# Patient Record
Sex: Male | Born: 1970
Health system: Southern US, Community
[De-identification: ages and names within clinical notes are randomized; demographics above are authoritative.]

---

## 2019-05-24 ENCOUNTER — Inpatient Hospital Stay (HOSPITAL_COMMUNITY): Payer: Medicaid Other

## 2019-05-24 ENCOUNTER — Inpatient Hospital Stay (HOSPITAL_COMMUNITY)
Admission: AD | Admit: 2019-05-24 | Discharge: 2019-06-01 | DRG: 870 | Disposition: A | Discharge status: Home Health | Payer: Medicaid Other | Source: Other Acute Inpatient Hospital | Attending: Internal Medicine | Admitting: Internal Medicine

## 2019-05-24 DIAGNOSIS — R319 Hematuria, unspecified: Secondary | ICD-10-CM | POA: Diagnosis present

## 2019-05-24 DIAGNOSIS — J95851 Ventilator associated pneumonia: Secondary | ICD-10-CM | POA: Diagnosis not present

## 2019-05-24 DIAGNOSIS — F1290 Cannabis use, unspecified, uncomplicated: Secondary | ICD-10-CM | POA: Diagnosis present

## 2019-05-24 DIAGNOSIS — S31000D Unspecified open wound of lower back and pelvis without penetration into retroperitoneum, subsequent encounter: Secondary | ICD-10-CM

## 2019-05-24 DIAGNOSIS — L89154 Pressure ulcer of sacral region, stage 4: Secondary | ICD-10-CM | POA: Diagnosis not present

## 2019-05-24 DIAGNOSIS — Z95828 Presence of other vascular implants and grafts: Secondary | ICD-10-CM | POA: Diagnosis not present

## 2019-05-24 DIAGNOSIS — S31000A Unspecified open wound of lower back and pelvis without penetration into retroperitoneum, initial encounter: Secondary | ICD-10-CM | POA: Diagnosis present

## 2019-05-24 DIAGNOSIS — Z1624 Resistance to multiple antibiotics: Secondary | ICD-10-CM | POA: Diagnosis present

## 2019-05-24 DIAGNOSIS — A4152 Sepsis due to Pseudomonas: Secondary | ICD-10-CM | POA: Diagnosis present

## 2019-05-24 DIAGNOSIS — G8250 Quadriplegia, unspecified: Secondary | ICD-10-CM | POA: Diagnosis not present

## 2019-05-24 DIAGNOSIS — J189 Pneumonia, unspecified organism: Secondary | ICD-10-CM | POA: Diagnosis not present

## 2019-05-24 DIAGNOSIS — Z791 Long term (current) use of non-steroidal anti-inflammatories (NSAID): Secondary | ICD-10-CM

## 2019-05-24 DIAGNOSIS — F1111 Opioid abuse, in remission: Secondary | ICD-10-CM | POA: Diagnosis present

## 2019-05-24 DIAGNOSIS — B1920 Unspecified viral hepatitis C without hepatic coma: Secondary | ICD-10-CM | POA: Diagnosis present

## 2019-05-24 DIAGNOSIS — D638 Anemia in other chronic diseases classified elsewhere: Secondary | ICD-10-CM | POA: Diagnosis present

## 2019-05-24 DIAGNOSIS — F419 Anxiety disorder, unspecified: Secondary | ICD-10-CM | POA: Diagnosis present

## 2019-05-24 DIAGNOSIS — Z931 Gastrostomy status: Secondary | ICD-10-CM

## 2019-05-24 DIAGNOSIS — F17210 Nicotine dependence, cigarettes, uncomplicated: Secondary | ICD-10-CM | POA: Diagnosis present

## 2019-05-24 DIAGNOSIS — Z20828 Contact with and (suspected) exposure to other viral communicable diseases: Secondary | ICD-10-CM | POA: Diagnosis present

## 2019-05-24 DIAGNOSIS — G40909 Epilepsy, unspecified, not intractable, without status epilepticus: Secondary | ICD-10-CM | POA: Diagnosis present

## 2019-05-24 DIAGNOSIS — J9690 Respiratory failure, unspecified, unspecified whether with hypoxia or hypercapnia: Secondary | ICD-10-CM

## 2019-05-24 DIAGNOSIS — Y95 Nosocomial condition: Secondary | ICD-10-CM | POA: Diagnosis present

## 2019-05-24 DIAGNOSIS — Z9359 Other cystostomy status: Secondary | ICD-10-CM

## 2019-05-24 DIAGNOSIS — Z936 Other artificial openings of urinary tract status: Secondary | ICD-10-CM

## 2019-05-24 DIAGNOSIS — Z825 Family history of asthma and other chronic lower respiratory diseases: Secondary | ICD-10-CM

## 2019-05-24 DIAGNOSIS — F1011 Alcohol abuse, in remission: Secondary | ICD-10-CM | POA: Diagnosis present

## 2019-05-24 DIAGNOSIS — Z452 Encounter for adjustment and management of vascular access device: Secondary | ICD-10-CM | POA: Diagnosis not present

## 2019-05-24 DIAGNOSIS — L8990 Pressure ulcer of unspecified site, unspecified stage: Secondary | ICD-10-CM | POA: Diagnosis present

## 2019-05-24 DIAGNOSIS — F1020 Alcohol dependence, uncomplicated: Secondary | ICD-10-CM | POA: Diagnosis not present

## 2019-05-24 DIAGNOSIS — J9621 Acute and chronic respiratory failure with hypoxia: Secondary | ICD-10-CM | POA: Diagnosis present

## 2019-05-24 DIAGNOSIS — I9589 Other hypotension: Secondary | ICD-10-CM | POA: Diagnosis not present

## 2019-05-24 DIAGNOSIS — Z933 Colostomy status: Secondary | ICD-10-CM | POA: Diagnosis not present

## 2019-05-24 DIAGNOSIS — Z6820 Body mass index (BMI) 20.0-20.9, adult: Secondary | ICD-10-CM

## 2019-05-24 DIAGNOSIS — R509 Fever, unspecified: Secondary | ICD-10-CM | POA: Diagnosis not present

## 2019-05-24 DIAGNOSIS — Z930 Tracheostomy status: Secondary | ICD-10-CM

## 2019-05-24 DIAGNOSIS — Z8619 Personal history of other infectious and parasitic diseases: Secondary | ICD-10-CM

## 2019-05-24 DIAGNOSIS — E440 Moderate protein-calorie malnutrition: Secondary | ICD-10-CM | POA: Diagnosis present

## 2019-05-24 DIAGNOSIS — G4700 Insomnia, unspecified: Secondary | ICD-10-CM | POA: Diagnosis present

## 2019-05-24 DIAGNOSIS — R6521 Severe sepsis with septic shock: Secondary | ICD-10-CM | POA: Diagnosis present

## 2019-05-24 DIAGNOSIS — Z792 Long term (current) use of antibiotics: Secondary | ICD-10-CM

## 2019-05-24 DIAGNOSIS — G4733 Obstructive sleep apnea (adult) (pediatric): Secondary | ICD-10-CM | POA: Diagnosis present

## 2019-05-24 DIAGNOSIS — Z880 Allergy status to penicillin: Secondary | ICD-10-CM

## 2019-05-24 DIAGNOSIS — Z79899 Other long term (current) drug therapy: Secondary | ICD-10-CM

## 2019-05-24 DIAGNOSIS — A419 Sepsis, unspecified organism: Secondary | ICD-10-CM | POA: Diagnosis present

## 2019-05-24 DIAGNOSIS — B182 Chronic viral hepatitis C: Secondary | ICD-10-CM | POA: Diagnosis present

## 2019-05-24 DIAGNOSIS — K7460 Unspecified cirrhosis of liver: Secondary | ICD-10-CM | POA: Diagnosis not present

## 2019-05-24 DIAGNOSIS — G894 Chronic pain syndrome: Secondary | ICD-10-CM | POA: Diagnosis present

## 2019-05-24 DIAGNOSIS — Z8739 Personal history of other diseases of the musculoskeletal system and connective tissue: Secondary | ICD-10-CM | POA: Diagnosis not present

## 2019-05-24 DIAGNOSIS — Z9911 Dependence on respirator [ventilator] status: Secondary | ICD-10-CM | POA: Diagnosis not present

## 2019-05-24 DIAGNOSIS — R000 Tachycardia, unspecified: Secondary | ICD-10-CM | POA: Diagnosis not present

## 2019-05-24 DIAGNOSIS — B965 Pseudomonas (aeruginosa) (mallei) (pseudomallei) as the cause of diseases classified elsewhere: Secondary | ICD-10-CM | POA: Diagnosis not present

## 2019-05-24 DIAGNOSIS — I10 Essential (primary) hypertension: Secondary | ICD-10-CM | POA: Diagnosis present

## 2019-05-24 DIAGNOSIS — Y848 Other medical procedures as the cause of abnormal reaction of the patient, or of later complication, without mention of misadventure at the time of the procedure: Secondary | ICD-10-CM | POA: Diagnosis present

## 2019-05-24 DIAGNOSIS — Z7901 Long term (current) use of anticoagulants: Secondary | ICD-10-CM

## 2019-05-24 DIAGNOSIS — L89004 Pressure ulcer of unspecified elbow, stage 4: Secondary | ICD-10-CM | POA: Diagnosis not present

## 2019-05-24 LAB — CREATININE, SERUM
Creatinine, Ser: 0.42 mg/dL — ABNORMAL LOW (ref 0.61–1.24)
GFR calc Af Amer: >60 mL/min (ref >60)
GFR calc non Af Amer: >60 mL/min (ref >60)

## 2019-05-24 LAB — CBC
HCT: 31.8 % — ABNORMAL LOW (ref 39.0–52.0)
Hemoglobin: 9.3 g/dL — ABNORMAL LOW (ref 13.0–17.0)
MCH: 27.2 pg (ref 26.0–34.0)
MCHC: 29.2 g/dL — ABNORMAL LOW (ref 30.0–36.0)
MCV: 93 fL (ref 80.0–100.0)
Platelets: 460 10*3/uL — ABNORMAL HIGH (ref 150–400)
RBC: 3.42 MIL/uL — ABNORMAL LOW (ref 4.22–5.81)
RDW: 16.4 % — ABNORMAL HIGH (ref 11.5–15.5)
WBC: 18.2 10*3/uL — ABNORMAL HIGH (ref 4.0–10.5)
nRBC: 0 % (ref 0.0–0.2)

## 2019-05-24 MED ORDER — FENTANYL CITRATE (PF) 100 MCG/2ML IJ SOLN
50.0000 ug | INTRAMUSCULAR | Status: DC | PRN
  Administered 2019-05-24 – 2019-05-25 (×3): 50 ug via INTRAVENOUS
  Administered 2019-05-25: 75 ug via INTRAVENOUS
  Administered 2019-05-26: 01:00:00 50 ug via INTRAVENOUS
  Administered 2019-05-26 (×2): 100 ug via INTRAVENOUS
  Administered 2019-05-26: 75 ug via INTRAVENOUS
  Administered 2019-05-27: 100 ug via INTRAVENOUS
  Administered 2019-05-27 – 2019-05-29 (×4): 50 ug via INTRAVENOUS
  Administered 2019-05-29: 100 ug via INTRAVENOUS
  Administered 2019-05-30: 50 ug via INTRAVENOUS
  Administered 2019-05-31: 100 ug via INTRAVENOUS
  Administered 2019-05-31 (×2): 50 ug via INTRAVENOUS
  Filled 2019-05-24 (×19): qty 2

## 2019-05-24 MED ORDER — IPRATROPIUM-ALBUTEROL 0.5-2.5 (3) MG/3ML IN SOLN
3.0000 mL | Freq: Four times a day (QID) | RESPIRATORY_TRACT | Status: DC
  Administered 2019-05-24 – 2019-06-01 (×32): 3 mL via RESPIRATORY_TRACT
  Filled 2019-05-24 (×33): qty 3

## 2019-05-24 MED ORDER — QUETIAPINE FUMARATE 50 MG PO TABS
50.0000 mg | ORAL_TABLET | Freq: Two times a day (BID) | ORAL | Status: DC
  Administered 2019-05-24 – 2019-05-28 (×8): 50 mg
  Filled 2019-05-24 (×8): qty 1

## 2019-05-24 MED ORDER — FENTANYL CITRATE (PF) 100 MCG/2ML IJ SOLN
50.0000 ug | INTRAMUSCULAR | Status: AC | PRN
  Administered 2019-05-26 (×3): 50 ug via INTRAVENOUS
  Filled 2019-05-24 (×4): qty 2

## 2019-05-24 MED ORDER — DOCUSATE SODIUM 50 MG/5ML PO LIQD: 100.0000 mg | Freq: Two times a day (BID) | ORAL | Status: DC | PRN

## 2019-05-24 MED ORDER — PHENYLEPHRINE HCL-NACL 10-0.9 MG/250ML-% IV SOLN: 25.0000 ug/min | INTRAVENOUS | Status: DC

## 2019-05-24 MED ORDER — FOLIC ACID 5 MG/ML IJ SOLN
1.0000 mg | Freq: Every day | INTRAMUSCULAR | Status: DC
  Administered 2019-05-24 – 2019-05-27 (×4): 1 mg via INTRAVENOUS
  Filled 2019-05-24 (×5): qty 0.2

## 2019-05-24 MED ORDER — THIAMINE HCL 100 MG/ML IJ SOLN
100.0000 mg | INTRAMUSCULAR | Status: DC
  Administered 2019-05-25 – 2019-05-27 (×4): 100 mg via INTRAVENOUS
  Filled 2019-05-24 (×5): qty 2

## 2019-05-24 MED ORDER — GABAPENTIN 250 MG/5ML PO SOLN
100.0000 mg | Freq: Three times a day (TID) | ORAL | Status: DC
  Administered 2019-05-24 – 2019-05-28 (×11): 100 mg
  Filled 2019-05-24 (×13): qty 2

## 2019-05-24 MED ORDER — ACETAMINOPHEN 160 MG/5ML PO SOLN
650.0000 mg | ORAL | Status: DC | PRN
  Administered 2019-05-24 – 2019-05-28 (×6): 650 mg
  Filled 2019-05-24 (×6): qty 20.3

## 2019-05-24 MED ORDER — HEPARIN SODIUM (PORCINE) 5000 UNIT/ML IJ SOLN
5000.0000 [IU] | Freq: Three times a day (TID) | INTRAMUSCULAR | Status: DC
  Administered 2019-05-24 – 2019-06-01 (×24): 5000 [IU] via SUBCUTANEOUS
  Filled 2019-05-24 (×24): qty 1

## 2019-05-24 MED ORDER — SODIUM CHLORIDE 0.9 % IV SOLN
250.0000 mL | INTRAVENOUS | Status: DC
  Administered 2019-05-24 – 2019-05-31 (×3): 250 mL via INTRAVENOUS

## 2019-05-24 MED ORDER — SODIUM CHLORIDE 0.9 % IV SOLN: INTRAVENOUS | Status: DC | PRN

## 2019-05-24 MED ORDER — FAMOTIDINE IN NACL 20-0.9 MG/50ML-% IV SOLN
20.0000 mg | Freq: Two times a day (BID) | INTRAVENOUS | Status: DC
  Administered 2019-05-24 – 2019-05-28 (×8): 20 mg via INTRAVENOUS
  Filled 2019-05-24 (×8): qty 50
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
## 2019-05-24 NOTE — Progress Notes (Signed)
Patient arrived to unit via [facility] with artificial airway already in place and patent. Placed patient on the ventilator with current settings per [facility] at 2042. Awaiting orders at this time.

## 2019-05-24 NOTE — Progress Notes (Signed)
[facility]Link Physician-Brief Progress Note Patient Name: Cody Vaughn DOB: [birth year] MRN: [id number]   Date of Service  05/24/2019  HPI/Events of Note  48 y/o M quadriplegic presumably from osteomyelitis, vent dependent s/p trach, PEG, colostomy and cystostomy, history of heroin use, Hep C/ETOH cirrhosis.  He was brought in due to fever and SOB  eICU Interventions   Antibiotics for pneumonia on CXR and possible UTI/erythema on cystostomy site  Fluid resuscitation as per sepsis protocol, pressors as needed  Respiratory support     Intervention Category Major Interventions: Respiratory failure - evaluation and management;Sepsis - evaluation and management Evaluation Type: New Patient Evaluation  Darl Pikes 05/24/2019, 9:55 PM

## 2019-05-25 ENCOUNTER — Inpatient Hospital Stay (HOSPITAL_COMMUNITY): Payer: Medicaid Other

## 2019-05-25 ENCOUNTER — Encounter (HOSPITAL_COMMUNITY): Payer: Self-pay | Admitting: Radiology

## 2019-05-25 DIAGNOSIS — K7460 Unspecified cirrhosis of liver: Secondary | ICD-10-CM

## 2019-05-25 DIAGNOSIS — S31000A Unspecified open wound of lower back and pelvis without penetration into retroperitoneum, initial encounter: Secondary | ICD-10-CM | POA: Diagnosis present

## 2019-05-25 DIAGNOSIS — A419 Sepsis, unspecified organism: Secondary | ICD-10-CM | POA: Diagnosis present

## 2019-05-25 DIAGNOSIS — F1020 Alcohol dependence, uncomplicated: Secondary | ICD-10-CM

## 2019-05-25 DIAGNOSIS — R509 Fever, unspecified: Secondary | ICD-10-CM

## 2019-05-25 DIAGNOSIS — R6521 Severe sepsis with septic shock: Secondary | ICD-10-CM

## 2019-05-25 DIAGNOSIS — L89154 Pressure ulcer of sacral region, stage 4: Secondary | ICD-10-CM

## 2019-05-25 DIAGNOSIS — Z9359 Other cystostomy status: Secondary | ICD-10-CM

## 2019-05-25 DIAGNOSIS — I9589 Other hypotension: Secondary | ICD-10-CM

## 2019-05-25 DIAGNOSIS — B182 Chronic viral hepatitis C: Secondary | ICD-10-CM

## 2019-05-25 DIAGNOSIS — G8250 Quadriplegia, unspecified: Secondary | ICD-10-CM

## 2019-05-25 DIAGNOSIS — Z8619 Personal history of other infectious and parasitic diseases: Secondary | ICD-10-CM

## 2019-05-25 DIAGNOSIS — Z452 Encounter for adjustment and management of vascular access device: Secondary | ICD-10-CM

## 2019-05-25 DIAGNOSIS — J9621 Acute and chronic respiratory failure with hypoxia: Secondary | ICD-10-CM

## 2019-05-25 DIAGNOSIS — Z9911 Dependence on respirator [ventilator] status: Secondary | ICD-10-CM

## 2019-05-25 LAB — URINALYSIS, ROUTINE W REFLEX MICROSCOPIC
Bilirubin Urine: NEGATIVE
Glucose, UA: NEGATIVE mg/dL
Ketones, ur: NEGATIVE mg/dL
Nitrite: POSITIVE — AB
Protein, ur: 30 mg/dL — AB
Specific Gravity, Urine: 1.018 (ref 1.005–1.030)
pH: 6 (ref 5.0–8.0)

## 2019-05-25 LAB — STREP PNEUMONIAE URINARY ANTIGEN: Strep Pneumo Urinary Antigen: NEGATIVE

## 2019-05-25 LAB — RESPIRATORY PANEL BY PCR

## 2019-05-25 LAB — MAGNESIUM: Magnesium: 1.9 mg/dL (ref 1.7–2.4)

## 2019-05-25 LAB — HIV ANTIBODY (ROUTINE TESTING W REFLEX): HIV Screen 4th Generation wRfx: NONREACTIVE

## 2019-05-25 LAB — BLOOD GAS, ARTERIAL
Acid-Base Excess: 3.6 mmol/L — ABNORMAL HIGH (ref 0.0–2.0)
Bicarbonate: 28.4 mmol/L — ABNORMAL HIGH (ref 20.0–28.0)
Drawn by: 235321
FIO2: 80
MECHVT: 420 mL
O2 Saturation: 99.2 %
PEEP: 8 cmH2O
Patient temperature: 98.6
RATE: 30 resp/min
pCO2 arterial: 47.4 mmHg (ref 32.0–48.0)
pH, Arterial: 7.395 (ref 7.350–7.450)
pO2, Arterial: 170 mmHg — ABNORMAL HIGH (ref 83.0–108.0)

## 2019-05-25 LAB — CBC
HCT: 26 % — ABNORMAL LOW (ref 39.0–52.0)
Hemoglobin: 7.9 g/dL — ABNORMAL LOW (ref 13.0–17.0)
MCH: 28 pg (ref 26.0–34.0)
MCHC: 30.4 g/dL (ref 30.0–36.0)
MCV: 92.2 fL (ref 80.0–100.0)
Platelets: 464 10*3/uL — ABNORMAL HIGH (ref 150–400)
RBC: 2.82 MIL/uL — ABNORMAL LOW (ref 4.22–5.81)
RDW: 16.4 % — ABNORMAL HIGH (ref 11.5–15.5)
WBC: 24.8 10*3/uL — ABNORMAL HIGH (ref 4.0–10.5)
nRBC: 0 % (ref 0.0–0.2)

## 2019-05-25 LAB — BASIC METABOLIC PANEL
Anion gap: 8 (ref 5–15)
BUN: 12 mg/dL (ref 6–20)
CO2: 23 mmol/L (ref 22–32)
Calcium: 8.3 mg/dL — ABNORMAL LOW (ref 8.9–10.3)
Chloride: 110 mmol/L (ref 98–111)
Creatinine, Ser: <0.3 mg/dL — ABNORMAL LOW (ref 0.61–1.24)
Glucose, Bld: 126 mg/dL — ABNORMAL HIGH (ref 70–99)
Potassium: 3 mmol/L — ABNORMAL LOW (ref 3.5–5.1)
Sodium: 141 mmol/L (ref 135–145)

## 2019-05-25 LAB — GLUCOSE, CAPILLARY
Glucose-Capillary: 100 mg/dL — ABNORMAL HIGH (ref 70–99)
Glucose-Capillary: 109 mg/dL — ABNORMAL HIGH (ref 70–99)
Glucose-Capillary: 113 mg/dL — ABNORMAL HIGH (ref 70–99)
Glucose-Capillary: 113 mg/dL — ABNORMAL HIGH (ref 70–99)

## 2019-05-25 LAB — RAPID URINE DRUG SCREEN, HOSP PERFORMED
Amphetamines: NOT DETECTED
Barbiturates: NOT DETECTED
Benzodiazepines: NOT DETECTED
Cocaine: NOT DETECTED
Opiates: NOT DETECTED
Tetrahydrocannabinol: NOT DETECTED

## 2019-05-25 LAB — PHOSPHORUS: Phosphorus: 5.6 mg/dL — ABNORMAL HIGH (ref 2.5–4.6)

## 2019-05-25 LAB — ECHOCARDIOGRAM COMPLETE
Height: 69 in
Weight: 2105.83 oz

## 2019-05-25 LAB — MRSA PCR SCREENING: MRSA by PCR: NEGATIVE

## 2019-05-25 MED ORDER — ORAL CARE MOUTH RINSE
15.0000 mL | OROMUCOSAL | Status: DC
  Administered 2019-05-25 – 2019-06-01 (×62): 15 mL via OROMUCOSAL

## 2019-05-25 MED ORDER — PRO-STAT SUGAR FREE PO LIQD
30.0000 mL | Freq: Two times a day (BID) | ORAL | Status: DC
  Administered 2019-05-25: 10:00:00 30 mL
  Filled 2019-05-25: qty 30

## 2019-05-25 MED ORDER — FREE WATER
100.0000 mL | Status: DC
  Administered 2019-05-25 – 2019-06-01 (×45): 100 mL

## 2019-05-25 MED ORDER — VITAL AF 1.2 CAL PO LIQD
1000.0000 mL | ORAL | Status: DC
  Administered 2019-05-25: 1000 mL

## 2019-05-25 MED ORDER — IOHEXOL 300 MG/ML  SOLN
100.0000 mL | Freq: Once | INTRAMUSCULAR | Status: AC | PRN
  Administered 2019-05-25: 17:00:00 100 mL via INTRAVENOUS

## 2019-05-25 MED ORDER — SODIUM CHLORIDE (PF) 0.9 % IJ SOLN
INTRAMUSCULAR | Status: AC
  Filled 2019-05-25: qty 50

## 2019-05-25 MED ORDER — POTASSIUM CHLORIDE 20 MEQ/15ML (10%) PO SOLN
40.0000 meq | ORAL | Status: AC
  Administered 2019-05-25 (×2): 40 meq
  Filled 2019-05-25 (×2): qty 30

## 2019-05-25 MED ORDER — VITAL HIGH PROTEIN PO LIQD
1000.0000 mL | ORAL | Status: AC
  Administered 2019-05-25: 10:00:00 1000 mL

## 2019-05-25 MED ORDER — MIDAZOLAM HCL 2 MG/2ML IJ SOLN
INTRAMUSCULAR | Status: AC
  Filled 2019-05-25: qty 2

## 2019-05-25 MED ORDER — PRO-STAT SUGAR FREE PO LIQD
30.0000 mL | Freq: Every day | ORAL | Status: DC
  Administered 2019-05-26: 30 mL
  Filled 2019-05-25: qty 30

## 2019-05-25 MED ORDER — CHLORHEXIDINE GLUCONATE 0.12% ORAL RINSE (MEDLINE KIT)
15.0000 mL | Freq: Two times a day (BID) | OROMUCOSAL | Status: DC
  Administered 2019-05-25 – 2019-06-01 (×15): 15 mL via OROMUCOSAL

## 2019-05-25 MED ORDER — IOHEXOL 300 MG/ML  SOLN: 30.0000 mL | Freq: Once | INTRAMUSCULAR | Status: DC | PRN

## 2019-05-25 MED ORDER — LACTATED RINGERS IV BOLUS
500.0000 mL | Freq: Once | INTRAVENOUS | Status: AC
  Administered 2019-05-25: 500 mL via INTRAVENOUS

## 2019-05-25 MED ORDER — NOREPINEPHRINE 4 MG/250ML-% IV SOLN
0.0000 ug/min | INTRAVENOUS | Status: DC
  Administered 2019-05-25 (×2): 2 ug/min via INTRAVENOUS
  Filled 2019-05-25: qty 250

## 2019-05-25 NOTE — Procedures (Signed)
Central Venous Catheter Insertion Procedure Note Cody Vaughn [id number] 03-Apr-1971  Procedure: Insertion of Central Venous Catheter Indications: Assessment of intravascular volume, Drug and/or fluid administration and Frequent blood sampling  Procedure Details Consent: Risks of procedure as well as the alternatives and risks of each were explained to the (patient/caregiver).  Consent for procedure obtained. Time Out: Verified patient identification, verified procedure, site/side was marked, verified correct patient position, special equipment/implants available, medications/allergies/relevent history reviewed, required imaging and test results available.  Performed  Maximum sterile technique was used including antiseptics, cap, gloves, gown, hand hygiene, mask and sheet. Skin prep: Chlorhexidine; local anesthetic administered A antimicrobial bonded/coated triple lumen catheter was placed in the right internal jugular vein using the Seldinger technique.  Evaluation Blood flow good Complications: No apparent complications Patient did tolerate procedure well. Chest X-ray ordered to verify placement.  CXR: normal.  Kristen D Scatliffe 05/25/2019, 2:59 AM
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
## 2019-05-25 NOTE — Procedures (Signed)
Arterial Catheter Insertion Procedure Note Cody Vaughn [id number] 1971-08-21  Procedure: Insertion of Arterial Catheter  Indications: Blood pressure monitoring and Frequent blood sampling  Procedure Details Consent: Risks of procedure as well as the alternatives and risks of each were explained to the (patient/caregiver).  Consent for procedure obtained. Time Out: Verified patient identification, verified procedure, site/side was marked, verified correct patient position, special equipment/implants available, medications/allergies/relevent history reviewed, required imaging and test results available.  Performed  Maximum sterile technique was used including antiseptics, cap, gloves, gown, hand hygiene, mask and sheet. Skin prep: Chlorhexidine; local anesthetic administered 20 gauge catheter was inserted into right femoral artery using the Seldinger technique. ULTRASOUND GUIDANCE USED: YES Evaluation Blood flow good; BP tracing good. Complications: No apparent complications.   Cody Vaughn 05/25/2019

## 2019-05-25 NOTE — Consult Note (Signed)
WOC Nurse wound consult note Patient receiving care in [facility] 1231. Reason for Consult: Patient arrived to [facility] with a wound VAC and limited history of the area to which the [facility] is applied. I have written for the bedside RN to remove the wound VAC and place wound characteristics into the flowsheet.  Then topical therapy of: Place saline moistened gauze into the sacral wound. Cover with ABD pad(s). Tape in place. Perform each shift. I will also request a photo of the wound to be placed in the EMR. Saline moistened gauze will provide moist wound healing while awaiting photo image of area.  WOC nurses are working remotely today.  I spoke with Konrad Felix, primary RN and explained my orders and that I was going to ask the MD for a photo of the wound.  She stated the doctor was in the room now and she would ask for the photo to be placed.  She agreed to call me if she needs any further guidance. Monitor the wound area(s) for worsening of condition such as: Signs/symptoms of infection,  Increase in size,  Development of or worsening of odor, Development of pain, or increased pain at the affected locations.  Notify the medical team if any of these develop.  Thank you for the consult.  Discussed plan of care with the bedside nurse.  WOC nurse will not follow at this time.  Please re-consult the WOC team if needed.  Helmut Muster, RN, MSN, CWOCN, CNS-BC, pager [phone]

## 2019-05-25 NOTE — Consult Note (Signed)
WOC Nurse wound consult note Patient receiving care in [facility] 1231.  Photos of sacral wound in record and reviewed. There appears to be heavy areas of necrosis in the sacral wound, and possibly palpable bone.  I recommend a surgical consult for possible surgical debridement.  Helmut Muster, RN, MSN, CWOCN, CNS-BC, pager [phone]

## 2019-05-25 NOTE — Progress Notes (Signed)
Initial Nutrition Assessment  DOCUMENTATION CODES:   Not applicable  INTERVENTION:  - will change TF order: 30 ml prostat once/day with Vital AF 1.2 @ 40 ml/hr to advance by 10 ml every 4 hours to reach goal rate of 60 ml/hr. - at goal rate, this regimen will provide 1828 kcal, 123 grams protein, and 1168 ml free water. - will order 100 ml free water every 4 hours (600 ml/day). - recommend 500 mg ascorbic acid BID and 220 mg zinc sulfate/day x10-14 days to aid in wound healing.  - will monitor for improvement in sepsis and resolution of septic shock and order Juven to aid in wound healing.    NUTRITION DIAGNOSIS:   Increased nutrient needs related to wound healing, acute illness(sepsis) as evidenced by estimated needs.  GOAL:   Patient will meet greater than or equal to 90% of their needs  MONITOR:   Vent status, TF tolerance, Labs, Weight trends, Skin  REASON FOR ASSESSMENT:   Ventilator, Consult Enteral/tube feeding initiation and management  ASSESSMENT:   48 year-old male with hx of quadriplegia, MDR Pseudomonas infection, OSA, chronic hepatitis C, cirrhosis 2/2 alcohol abuse, and seizures. Patient presented from [facility], where he had arrived d/t fever and worsening SOB which began the evening of 5/26. Patient with trach and is vent dependent at baseline with known frequent mucous plugging. [facility]. [facility], he was connected to vent, able to speak over vent and stated that he had a fever which has now passed and chills that started 1 day ago. He felt as if he was unable to breath despite O2 sats of 95%. 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 Patient admitted with sepsis 2/2 PNA.  Patient intubated via trach (baseline) with G-J tube in place and is receiving TF via TF protocol: Vital High Protein @ 40 ml/hr with 30 ml prostat BID. This regimen  provides 1160 kcal, 114 grams protein, and 802 ml free water. Patient has eyes open but does not interact in any way or track RD around the room. NFPE findings outlined below; suspect wasting is 2/2 quadriplegia but will monitor weight trends to determine if there could be a component of malnutrition. Current weight is 132 lb and no PTA weight available.   Per notes: acute on chronic respiratory failure with hypoxia, septic shock, goal MAP >65, chronic pain, remote hx of alcohol abuse, heroin use, and smoke--daughter reported not using for >1 year and on thiamine and folic acid supplements at home, anemia.    Patient is currently intubated on ventilator support MV: 11.7 L/min Temp (24hrs), Avg:99 F (37.2 C), Min:97.6 F (36.4 C), Max:100.1 F (37.8 C) Propofol: none BP: 93/52 and MAP: 64   Medications reviewed; 20 mg IV pepcid BID, 1 mg IV folic acid/day, 40 mEq IC KCl per G-tube x2 doses 5/28, 100 mg IV thiamine/day.  Labs reviewed; K: 3 mmol/l, creatinine: <0.3 mg/dl, Ca: 8.3 mg/dl, Phos: 5.6 mg/dl.       NUTRITION - FOCUSED PHYSICAL EXAM:    Most Recent Value  Orbital Region  No depletion  Upper Arm Region  Mild depletion  Thoracic and Lumbar Region  Mild depletion  Buccal Region  Mild depletion  Temple Region  Mild depletion  Clavicle Bone Region  Moderate depletion  Clavicle and Acromion Bone Region  Moderate depletion  Scapular Bone  Region  Unable to assess  Dorsal Hand  No depletion  Patellar Region  No depletion  Anterior Thigh Region  Unable to assess  Posterior Calf Region  Mild depletion  Edema (RD Assessment)  None  Hair  Reviewed  Eyes  Reviewed  Mouth  Unable to assess  Skin  Reviewed  Nails  Reviewed       Diet Order:   Diet Order            Diet NPO time specified  Diet effective now              EDUCATION NEEDS:   No education needs have been identified at this time  Skin:  Skin Assessment: Skin Integrity Issues: Skin Integrity Issues::  Stage IV Stage IV: coccyx with wound vac  Last BM:  5/28 (colostomy)  Height:   Ht Readings from Last 1 Encounters:  05/24/19 5\' 9"  (1.753 m)    Weight:   Wt Readings from Last 1 Encounters:  05/25/19 59.7 kg    Ideal Body Weight:  65.4 kg  BMI:  Body mass index is 19.44 kg/m.  Estimated Nutritional Needs:   Kcal:  1860 kcal  Protein:  >/= 115 grams  Fluid:  >/= 2 L/day     Trenton Gammon, MS, RD, LDN, [facility] Inpatient Clinical Dietitian Pager # [phone] After hours/weekend pager # [phone]

## 2019-05-25 NOTE — Progress Notes (Addendum)
eLink Physician-Brief Progress Note Patient Name: Cody Vaughn DOB: [birth year] MRN: [id number]   Date of Service  05/25/2019  HPI/Events of Note  CVP 6 on positive pressure ventilation On norepinephrine  eICU Interventions  Ordered 500 cc LR bolus     Intervention Category Major Interventions: Hypotension - evaluation and management  Darl Pikes 05/25/2019, 3:34 AM

## 2019-05-25 NOTE — Consult Note (Signed)
Date of Admission:  05/24/2019          Reason for Consult: fever, hypoxia and hx of MDR pseudomonas    Referring Provider: Dr. Burnett Harry   Assessment:  1. Fever 2. Hypoxia 3. Stage IV decubitus ulcer 4. Quadriplegia 5. Hx of C spine infection 6. Recent hospitalization at [facility] rx with zosyn sp bronchoscopy  Which yielded MDR Ps aeruginosa which was felt to be COLONIZER and not treated 7. Chronic hypotension 8. Chronic HCV without hepatic coma --not treated I believe  Plan:  1. HOLD antibiotics 2. IT needs to FIX portal to Care Everywhere 3. IF he develops more secretions send for cultures and reat CXR and consider CT chest 4. Consider CT abdomen and pelvis as well  Active Problems:   Acute on chronic respiratory failure with hypoxia (HCC)   Septic shock (HCC)   Encounter for central line placement   Suprapubic catheter (HCC)   Ventilator dependence ([place])   Sacral wound   Scheduled Meds:  chlorhexidine gluconate (MEDLINE KIT)  15 mL Mouth Rinse BID   [START ON 05/26/2019] feeding supplement (PRO-STAT SUGAR FREE 64)  30 mL Per Tube Daily   folic acid  1 mg Intravenous Daily   free water  100 mL Per Tube Q4H   gabapentin  100 mg Per Tube Q8H   heparin  5,000 Units Subcutaneous Q8H   ipratropium-albuterol  3 mL Nebulization Q6H   mouth rinse  15 mL Mouth Rinse 10 times per day   QUEtiapine  50 mg Per Tube Q12H   thiamine injection  100 mg Intravenous Q24H   Continuous Infusions:  sodium chloride 7.5 mL/hr at 05/25/19 0802   sodium chloride     famotidine (PEPCID) IV Stopped (05/25/19 1013)   feeding supplement (VITAL AF 1.2 CAL)     norepinephrine (LEVOPHED) Adult infusion Stopped (05/25/19 0929)   phenylephrine (NEO-SYNEPHRINE) Adult infusion Stopped (05/24/19 2325)   PRN Meds:.Place/Maintain arterial line **AND** sodium chloride, acetaminophen (TYLENOL) oral liquid 160 mg/5 mL, docusate, fentaNYL (SUBLIMAZE) injection, fentaNYL (SUBLIMAZE)  injection  HPI: Cody Vaughn is a 48 y.o. male with quadraplegia, with ventilator dependence, chronic hepatitis C with cirrhosis, and comorbid alcoholism who has been in and out of the hospital at [facility] as well as [facility] over the last year.  Note the history I am obtaining is from our notes in epic from conversations with his daughter and from conversations with Dr. Garnette Scheuermann at [facility] who helped me in looking through the patient's medical records at [facility].  Currently there is a problem with the IT interface and we are not able to see any of the records from [facility] or [facility].  However from talking to the daughter as mentioned the patient has been apparently in and out of the ICU at [facility] over the last year and more recently admitted to [facility].  She described him having a C-spine infection that was treated as well which she believed had resolved.  She did not believe his hepatitis C was ever treated.  She said that his stage IV decubitus ulcer which went to bone was known about and was proper it on previously at [facility] and [facility].  In talking to [place] it appears the patient was recently hospitalized at [facility] and treated for aspiration pneumonia with Zosyn.  His course was  complicated by mucous plugging and he underwent bronchoscopy on 15 May.  After bronchoscopy he had improvement in his symptoms and did well on Zosyn.  Cultures obtained at the time of bronchoscopy grew a multidrug-resistant pseudomonas aeruginosa which he is apparently repeatedly grown in the past.  This organism was not targeted with therapy since the patient was improving and ultimately he was discharged on oral Levaquin.  In the interim he had 2 days of having drenching sweats and apparently desaturating into the 70s per his  daughter.  He was sent to [facility] with a been admitted there but they did not have enough ICU beds and instead plans were made to transfer the patient to our facility.  At that time you supposedly having copious secretions.  I do not have access to the records at [facility] as far as whether they obtained a culture.  I did talk to Dr. Garnette Scheuermann who stated that he had recommended that they were concerned about this organism that they could start avycaz and he may have been given a dose at [facility] prior to transfer.  Upon arrival here he was placed on pressors though he is chronically hypotensive and currently is no longer on pressors orders were made to obtain a tracheal aspirate but in talking with his nurse today the patient is not making much in the way of secretions and they are unable to obtain a tracheal aspirate for culture.  Chest x-ray done here had shown a right lower lobe infiltrate.  He has a low-grade temperature and certainly has leukocytosis.  In talking to the nurse and looking through the notes from the ICU team the patient apparently had some purulence around his sacral decubitus ulcer.  Pictures are noted below.      At present given the fact that he is not producing secretions I do not have an interest in initiating broad-spectrum antibiotics to treat for a ventilator associated pneumonia.  I would like to observe him off antibiotics.  I will go ahead and obtain a CT chest abdomen pelvis to give [place] more data than we currently have as far as his lungs and his abdomen including his sacral decubitus ulcer.  If his decubitus ulcer might be the source then will benefit in having surgical consultation with debridement for deep cultures and then hopefully targeted therapy.  Certainly should his secretions increase I would get cultures from them as well.     Review of Systems: Review of Systems  Unable to perform ROS: Patient nonverbal    No  past medical history on file.  Social History   Tobacco Use   Smoking status: Not on file  Substance Use Topics   Alcohol use: Not on file   Drug use: Not on file    No family history on file. Allergies not on file  OBJECTIVE: Blood pressure (!) 112/53, pulse (!) 104, temperature 98.7 F (37.1 C), temperature source Oral, resp. rate (!) 30, height _0  (1.753 m), weight 59.7 kg, SpO2 100 %.  Physical Exam Constitutional:      Appearance: He is ill-appearing.  HENT:     Head: Normocephalic.  Eyes:     General: No scleral icterus.    Extraocular Movements: Extraocular movements intact.  Cardiovascular:     Rate and Rhythm: Regular rhythm. Tachycardia present.     Heart sounds: No murmur. No friction rub. No gallop.   Pulmonary:     Effort: No respiratory distress.  Breath sounds: No rhonchi.  Abdominal:     General: Bowel sounds are normal. There is no distension.     Palpations: There is no mass.  Musculoskeletal:        General: Deformity present. No swelling.  Skin:    General: Skin is warm.     Coloration: Skin is not jaundiced.  Neurological:     Mental Status: He is alert.   Patient was not responsivre when I examined him today  He is quadraplegic with spastic contractures  Lab Results Lab Results  Component Value Date   WBC 24.8 (H) 05/25/2019   HGB 7.9 (L) 05/25/2019   HCT 26.0 (L) 05/25/2019   MCV 92.2 05/25/2019   PLT 464 (H) 05/25/2019    Lab Results  Component Value Date   CREATININE <0.30 (L) 05/25/2019   BUN 12 05/25/2019   NA 141 05/25/2019   K 3.0 (L) 05/25/2019   CL 110 05/25/2019   CO2 23 05/25/2019   No results found for: ALT, AST, GGT, ALKPHOS, BILITOT   Microbiology: Recent Results (from the past 240 hour(s))  MRSA PCR Screening     Status: None   Collection Time: 05/24/19  8:59 PM  Result Value Ref Range Status   MRSA by PCR NEGATIVE NEGATIVE Final    Comment:        The GeneXpert MRSA Assay (FDA approved for NASAL  specimens only), is one component of a comprehensive MRSA colonization surveillance program. It is not intended to diagnose MRSA infection nor to guide or monitor treatment for MRSA infections. Performed at [facility], [place] [address], [place], [place] [address]   Respiratory Panel by PCR     Status: None   Collection Time: 05/24/19 10:12 PM  Result Value Ref Range Status   Adenovirus NOT DETECTED NOT DETECTED Final   Coronavirus 229E NOT DETECTED NOT DETECTED Final    Comment: (NOTE) The Coronavirus on the Respiratory Panel, DOES NOT test for the novel  Coronavirus (2019 nCoV)    Coronavirus HKU1 NOT DETECTED NOT DETECTED Final   Coronavirus NL63 NOT DETECTED NOT DETECTED Final   Coronavirus OC43 NOT DETECTED NOT DETECTED Final   Metapneumovirus NOT DETECTED NOT DETECTED Final   Rhinovirus / Enterovirus NOT DETECTED NOT DETECTED Final   Influenza A NOT DETECTED NOT DETECTED Final   Influenza B NOT DETECTED NOT DETECTED Final   Parainfluenza Virus 1 NOT DETECTED NOT DETECTED Final   Parainfluenza Virus 2 NOT DETECTED NOT DETECTED Final   Parainfluenza Virus 3 NOT DETECTED NOT DETECTED Final   Parainfluenza Virus 4 NOT DETECTED NOT DETECTED Final   Respiratory Syncytial Virus NOT DETECTED NOT DETECTED Final   Bordetella pertussis NOT DETECTED NOT DETECTED Final   Chlamydophila pneumoniae NOT DETECTED NOT DETECTED Final   Mycoplasma pneumoniae NOT DETECTED NOT DETECTED Final    Comment: Performed at [facility], [place]. [address] [place], [place] [address]    Alcide Evener, [place] [facility] [phone] pager  05/25/2019, 12:51 PM

## 2019-05-25 NOTE — Progress Notes (Signed)
Rt was having trouble with the pt's SATS and changed his Probe to his ear and his SAT was worse with no wave form. RT placed another probe on his finger and increased his FIO2 to 40% and  his SATS were 96%. RT will continue to monitor.

## 2019-05-25 NOTE — Progress Notes (Signed)
  Echocardiogram 2D Echocardiogram has been performed.  Cody Vaughn 05/25/2019, 10:46 AM

## 2019-05-25 NOTE — Progress Notes (Signed)
Wound vac removed. MD took pictures for chart. Wet to dry dressing applied.

## 2019-05-26 DIAGNOSIS — R000 Tachycardia, unspecified: Secondary | ICD-10-CM

## 2019-05-26 DIAGNOSIS — L8990 Pressure ulcer of unspecified site, unspecified stage: Secondary | ICD-10-CM | POA: Diagnosis present

## 2019-05-26 DIAGNOSIS — S31000A Unspecified open wound of lower back and pelvis without penetration into retroperitoneum, initial encounter: Secondary | ICD-10-CM

## 2019-05-26 DIAGNOSIS — Z8739 Personal history of other diseases of the musculoskeletal system and connective tissue: Secondary | ICD-10-CM

## 2019-05-26 DIAGNOSIS — J95851 Ventilator associated pneumonia: Secondary | ICD-10-CM

## 2019-05-26 LAB — CBC
HCT: 24.3 % — ABNORMAL LOW (ref 39.0–52.0)
Hemoglobin: 7.1 g/dL — ABNORMAL LOW (ref 13.0–17.0)
MCH: 26.6 pg (ref 26.0–34.0)
MCHC: 29.2 g/dL — ABNORMAL LOW (ref 30.0–36.0)
MCV: 91 fL (ref 80.0–100.0)
Platelets: 372 10*3/uL (ref 150–400)
RBC: 2.67 MIL/uL — ABNORMAL LOW (ref 4.22–5.81)
RDW: 16.8 % — ABNORMAL HIGH (ref 11.5–15.5)
WBC: 19.4 10*3/uL — ABNORMAL HIGH (ref 4.0–10.5)
nRBC: 0 % (ref 0.0–0.2)

## 2019-05-26 LAB — GLUCOSE, CAPILLARY
Glucose-Capillary: 114 mg/dL — ABNORMAL HIGH (ref 70–99)
Glucose-Capillary: 110 mg/dL — ABNORMAL HIGH (ref 70–99)
Glucose-Capillary: 120 mg/dL — ABNORMAL HIGH (ref 70–99)
Glucose-Capillary: 105 mg/dL — ABNORMAL HIGH (ref 70–99)
Glucose-Capillary: 96 mg/dL (ref 70–99)

## 2019-05-26 LAB — BASIC METABOLIC PANEL
Anion gap: 9 (ref 5–15)
BUN: 14 mg/dL (ref 6–20)
CO2: 26 mmol/L (ref 22–32)
Calcium: 9 mg/dL (ref 8.9–10.3)
Chloride: 101 mmol/L (ref 98–111)
Creatinine, Ser: 0.44 mg/dL — ABNORMAL LOW (ref 0.61–1.24)
GFR calc Af Amer: >60 mL/min (ref >60)
GFR calc non Af Amer: >60 mL/min (ref >60)
Glucose, Bld: 134 mg/dL — ABNORMAL HIGH (ref 70–99)
Potassium: 3.7 mmol/L (ref 3.5–5.1)
Sodium: 136 mmol/L (ref 135–145)

## 2019-05-26 LAB — LEGIONELLA PNEUMOPHILA SEROGP 1 UR AG: L. pneumophila Serogp 1 Ur Ag: NEGATIVE

## 2019-05-26 LAB — MAGNESIUM: Magnesium: 2.3 mg/dL (ref 1.7–2.4)

## 2019-05-26 MED ORDER — SODIUM CHLORIDE 0.9 % IV SOLN
1.5000 g | Freq: Three times a day (TID) | INTRAVENOUS | Status: DC
  Administered 2019-05-26: 11:00:00 1.5 g via INTRAVENOUS
  Filled 2019-05-26 (×2): qty 11.4

## 2019-05-26 MED ORDER — SODIUM CHLORIDE 0.9 % IV SOLN
100.0000 mg | Freq: Two times a day (BID) | INTRAVENOUS | Status: DC
  Administered 2019-05-26 – 2019-05-27 (×4): 100 mg via INTRAVENOUS
  Filled 2019-05-26 (×6): qty 10

## 2019-05-26 MED ORDER — SODIUM CHLORIDE 0.9 % IV SOLN
3.0000 g | Freq: Three times a day (TID) | INTRAVENOUS | Status: DC
  Administered 2019-05-26 – 2019-06-01 (×19): 3 g via INTRAVENOUS
  Filled 2019-05-26 (×21): qty 22.8

## 2019-05-26 MED ORDER — MIDODRINE HCL 5 MG PO TABS: 5.0000 mg | ORAL_TABLET | Freq: Three times a day (TID) | ORAL | Status: DC

## 2019-05-26 MED ORDER — MIDODRINE HCL 5 MG PO TABS
5.0000 mg | ORAL_TABLET | Freq: Three times a day (TID) | ORAL | Status: DC
  Administered 2019-05-26 (×2): 5 mg
  Filled 2019-05-26 (×2): qty 1

## 2019-05-26 MED ORDER — CHLORHEXIDINE GLUCONATE CLOTH 2 % EX PADS
6.0000 | MEDICATED_PAD | Freq: Every day | CUTANEOUS | Status: DC
  Administered 2019-05-26: 10:00:00 6 via TOPICAL

## 2019-05-26 MED ORDER — DICLOFENAC SODIUM 1 % TD GEL
2.0000 g | Freq: Four times a day (QID) | TRANSDERMAL | Status: DC
  Administered 2019-05-26 – 2019-06-01 (×26): 2 g via TOPICAL
  Filled 2019-05-26: qty 100

## 2019-05-26 MED ORDER — DAKINS (1/4 STRENGTH) 0.125 % EX SOLN
Freq: Two times a day (BID) | CUTANEOUS | Status: AC
  Administered 2019-05-26 – 2019-05-28 (×5)
  Filled 2019-05-26: qty 473

## 2019-05-26 NOTE — Consult Note (Addendum)
[facility] Surgery Consult Note  KONOR NOREN 18-Jul-1971  [id number].    Requesting MD: Dr. 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  He was transferred to [facility] as there is no ICU beds available at [facility] or [facility].  His COVID test was negative at [facility].  General surgery was consulted for his chronic sacral wound.  His history is obtained by chart review, transfer documents in patient's paper chart, and the patient himself.  Unfortunately care everywhere is not available for the patient and history is limited currently on epic.  The patient apparently has had his sacral wound for some time.  He has received care for this at [facility] in the past.  Patient does live at home and receives assistance from his daughter for this as well as a nurse that visits his house daily.  No recent changes in his sacral wound. When he arrived he was currently being treated with a wound vac. He has been changed to wet to dry dressings since that time. It is unclear if he still takes Xarelto at home. General surgery was asked to see for recommendations.   ROS: Review of Systems  Constitutional: Positive for chills and fever.  Respiratory: Positive for sputum production and shortness of breath.   Gastrointestinal: Negative for abdominal pain, constipation, diarrhea, nausea and vomiting.  Genitourinary: Negative for dysuria.  Skin:       Sacral  wound  All other systems reviewed and are negative.   No family history on file.  History reviewed. No pertinent past medical history.  History reviewed. No pertinent surgical history.  Social History:  has no history on file for tobacco, alcohol, and drug.  Allergies:  Allergies  Allergen Reactions  . Penicillins Other (See Comments)    Daughter isn't sure what kind of reaction her father had with penicillin    Medications Prior to Admission  Medication Sig Dispense Refill  . celecoxib (CELEBREX) 100 MG capsule Take 100 mg by mouth 2 (two) times daily.    . famotidine (PEPCID) 40 MG tablet Take 40 mg by mouth at bedtime as needed for heartburn or indigestion.    . fluconazole (DIFLUCAN) 40 MG/ML suspension Place 40 mg into feeding tube daily.    . folic acid (FOLVITE) 1 MG tablet Take 1 mg by mouth daily.    Marland Kitchen gabapentin (NEURONTIN) 300 MG capsule Take 900 mg by mouth 3 (three) times daily.    Marland Kitchen ipratropium-albuterol (DUONEB) 0.5-2.5 (3) MG/3ML SOLN Take 3 mLs by nebulization every 4 (four) hours as needed (wheezing, shortness of breath).    . Lacosamide 100 MG TABS Take 100 mg by mouth 2 (two) times daily.    Marland Kitchen levofloxacin (LEVAQUIN) 750 MG tablet Take 750 mg by mouth at bedtime.    . methocarbamol (ROBAXIN) 500 MG tablet Take 1,000 mg by mouth 3 (three) times daily.    . midodrine (PROAMATINE) 10 MG tablet Take 10 mg by mouth every 8 (eight) hours.    . Multiple Vitamin (  MULTIVITAMIN) tablet Take 1 tablet by mouth daily.    . rivaroxaban (XARELTO) 20 MG TABS tablet Take 20 mg by mouth daily with supper.    . simethicone (MYLICON) 125 MG chewable tablet Chew 125 mg by mouth every 6 (six) hours as needed for flatulence.    . thiamine (VITAMIN B-1) 100 MG tablet Take 100 mg by mouth daily.    Marland Kitchen venlafaxine XR (EFFEXOR-XR) 75 MG 24 hr capsule Take 75 mg by mouth 2 (two) times a day.     Vent Mode: PRVC FiO2 (%):  [30 %-60 %] 30 % Set Rate:  [30 bmp] 30 bmp Vt Set:  [420 mL]  420 mL PEEP:  [5 cmH20-8 cmH20] 5 cmH20 Plateau Pressure:  [11 cmH20-19 cmH20] 11 cmH20  Blood pressure (!) 102/59, pulse (!) 120, temperature 100.1 F (37.8 C), temperature source Oral, resp. rate (!) 32, height 5\' 9"  (1.753 m), weight 59.8 kg, SpO2 94 %. Physical Exam: Physical Exam Vitals signs and nursing note reviewed. Exam conducted with a chaperone present.  Constitutional:      General: He is awake.     Appearance: He is underweight. He is ill-appearing.     Comments: Frail. Trach present   HENT:     Head: Normocephalic and atraumatic.     Right Ear: External ear normal.     Left Ear: External ear normal.     Nose: Nose normal.     Mouth/Throat:     Lips: Pink.     Pharynx: Oropharynx is clear.  Eyes:     General: Lids are normal.     Conjunctiva/sclera: Conjunctivae normal.  Neck:     Musculoskeletal: Neck supple.     Comments: Janina Mayo present  Cardiovascular:     Rate and Rhythm: Regular rhythm. Tachycardia present.  Pulmonary:     Comments: On vent by trach. Normal effort. Crackles at bases b/l.  Abdominal:     General: Abdomen is flat. Bowel sounds are normal. There is no distension.     Palpations: Abdomen is soft.     Tenderness: There is no abdominal tenderness.     Hernia: No hernia is present.     Comments: Colostomy present with air and stool in bag. PEG tube in place. Suprabubic catheter in place.   Genitourinary:    Comments: Please see picture below. 8cm sacral wound with bone exposed. Tissue is beefy red and appears healthy. There is no undermining. No purulence.  Musculoskeletal:     Comments: Chronic contractures   Skin:    General: Skin is warm and dry.  Neurological:     Mental Status: He is alert.  Psychiatric:        Attention and Perception: Attention normal.        Mood and Affect: Mood normal.        Behavior: Behavior is cooperative.         Results for orders placed or performed during the hospital encounter of 05/24/19 (from the  past 48 hour(s))  MRSA PCR Screening     Status: None   Collection Time: 05/24/19  8:59 PM  Result Value Ref Range   MRSA by PCR NEGATIVE NEGATIVE    Comment:        The GeneXpert MRSA Assay (FDA approved for NASAL specimens only), is one component of a comprehensive MRSA colonization surveillance program. It is not intended to diagnose MRSA infection nor to guide or monitor treatment for MRSA infections. Performed at [facility], [address] [place], [place] [address]   Respiratory Panel by PCR     Status: None   Collection Time: 05/24/19 10:12 PM  Result Value Ref Range   Adenovirus NOT DETECTED NOT DETECTED   Coronavirus 229E NOT DETECTED NOT DETECTED    Comment: (NOTE) The Coronavirus on the Respiratory Panel, DOES NOT test for the novel  Coronavirus (2019 nCoV)    Coronavirus HKU1 NOT DETECTED NOT DETECTED   Coronavirus NL63 NOT DETECTED NOT DETECTED   Coronavirus OC43 NOT DETECTED NOT DETECTED   Metapneumovirus NOT DETECTED NOT DETECTED   Rhinovirus / Enterovirus NOT DETECTED NOT DETECTED   Influenza A NOT DETECTED NOT DETECTED   Influenza B NOT DETECTED NOT DETECTED   Parainfluenza Virus 1 NOT DETECTED NOT DETECTED   Parainfluenza Virus 2 NOT DETECTED NOT DETECTED   Parainfluenza Virus 3 NOT DETECTED NOT DETECTED   Parainfluenza Virus 4 NOT DETECTED NOT DETECTED   Respiratory Syncytial Virus NOT DETECTED NOT DETECTED   Bordetella pertussis NOT DETECTED NOT DETECTED   Chlamydophila pneumoniae NOT DETECTED NOT DETECTED   Mycoplasma pneumoniae NOT DETECTED NOT DETECTED    Comment: Performed at [facility], [address], [place], [place] [address]  Strep pneumoniae urinary antigen  (not at [facility])     Status: None   Collection Time: 05/24/19 10:34 PM  Result Value Ref Range   Strep Pneumo Urinary Antigen NEGATIVE NEGATIVE    Comment:        Infection due to S. pneumoniae cannot be absolutely ruled out since the antigen present may be  below the detection limit of the test. Performed at [facility], [address] [place], [place] [address]   Urinalysis, Routine w reflex microscopic     Status: Abnormal   Collection Time: 05/24/19 10:34 PM  Result Value Ref Range   Color, Urine YELLOW YELLOW   APPearance HAZY (A) CLEAR   Specific Gravity, Urine 1.018 1.005 - 1.030   pH 6.0 5.0 - 8.0   Glucose, UA NEGATIVE NEGATIVE mg/dL   Hgb urine dipstick MODERATE (A) NEGATIVE   Bilirubin Urine NEGATIVE NEGATIVE   Ketones, ur NEGATIVE NEGATIVE mg/dL   Protein, ur 30 (A) NEGATIVE mg/dL   Nitrite POSITIVE (A) NEGATIVE   Leukocytes,Ua SMALL (A) NEGATIVE   RBC / HPF 11-20 0 - 5 RBC/hpf   WBC, UA 21-50 0 - 5 WBC/hpf   Bacteria, UA RARE (A) NONE SEEN   Squamous Epithelial / LPF 0-5 0 - 5   Mucus PRESENT    Hyaline Casts, UA PRESENT    Ca Oxalate Crys, UA PRESENT     Comment: Performed at [facility], [address] [place], [place] [address]  Urine rapid drug screen (hosp performed)     Status: None   Collection Time: 05/24/19 10:34 PM  Result Value Ref Range   Opiates NONE DETECTED NONE DETECTED   Cocaine NONE DETECTED NONE DETECTED   Benzodiazepines NONE DETECTED NONE DETECTED   Amphetamines NONE DETECTED NONE DETECTED   Tetrahydrocannabinol NONE DETECTED NONE DETECTED   Barbiturates NONE DETECTED NONE DETECTED    Comment: (NOTE) DRUG SCREEN FOR MEDICAL PURPOSES ONLY.  IF CONFIRMATION IS NEEDED FOR ANY PURPOSE, NOTIFY LAB WITHIN 5 DAYS. LOWEST DETECTABLE LIMITS FOR URINE DRUG SCREEN Drug Class                     Cutoff (ng/mL) Amphetamine and metabolites    1000 Barbiturate  and metabolites    200 Benzodiazepine                 200 Tricyclics and metabolites     300 Opiates and metabolites        300 Cocaine and metabolites        300 THC                            50 Performed at [facility], [address], [place], [place] [address]   HIV antibody (Routine  Testing)     Status: None   Collection Time: 05/24/19 10:43 PM  Result Value Ref Range   HIV Screen 4th Generation wRfx Non Reactive Non Reactive    Comment: (NOTE) Performed At: [facility] [address] [place], [place] [id number] Jolene Schimke MD AO:[id number]   CBC     Status: Abnormal   Collection Time: 05/24/19 10:43 PM  Result Value Ref Range   WBC 18.2 (H) 4.0 - 10.5 K/uL   RBC 3.42 (L) 4.22 - 5.81 MIL/uL   Hemoglobin 9.3 (L) 13.0 - 17.0 g/dL   HCT 96.2 (L) 95.2 - 84.1 %   MCV 93.0 80.0 - 100.0 fL   MCH 27.2 26.0 - 34.0 pg   MCHC 29.2 (L) 30.0 - 36.0 g/dL   RDW 32.4 (H) 40.1 - 02.7 %   Platelets 460 (H) 150 - 400 K/uL   nRBC 0.0 0.0 - 0.2 %    Comment: Performed at [facility], [address], [place], [place] [address]  Creatinine, serum     Status: Abnormal   Collection Time: 05/24/19 10:43 PM  Result Value Ref Range   Creatinine, Ser 0.42 (L) 0.61 - 1.24 mg/dL   GFR calc non Af Amer >60 >60 mL/min   GFR calc Af Amer >60 >60 mL/min    Comment: Performed at [facility], [address], [place], [place] [address]  Blood gas, arterial     Status: Abnormal   Collection Time: 05/25/19  2:30 AM  Result Value Ref Range   FIO2 80.00    Delivery systems VENTILATOR    Mode PRESSURE REGULATED VOLUME CONTROL    VT 420 mL   LHR 30 resp/min   Peep/cpap 8.0 cm H20   pH, Arterial 7.395 7.350 - 7.450   pCO2 arterial 47.4 32.0 - 48.0 mmHg   pO2, Arterial 170 (H) 83.0 - 108.0 mmHg   Bicarbonate 28.4 (H) 20.0 - 28.0 mmol/L   Acid-Base Excess 3.6 (H) 0.0 - 2.0 mmol/L   O2 Saturation 99.2 %   Patient temperature 98.6    Collection site A-LINE    Drawn by [id number]    Sample type ARTERIAL DRAW     Comment: Performed at [facility], [address] [place], [place] [address]  CBC     Status: Abnormal   Collection Time: 05/25/19  2:34 AM  Result Value Ref Range   WBC 24.8 (H) 4.0 - 10.5 K/uL   RBC 2.82 (L) 4.22  - 5.81 MIL/uL   Hemoglobin 7.9 (L) 13.0 - 17.0 g/dL   HCT 64.3 (L) 32.9 - 51.8 %   MCV 92.2 80.0 - 100.0 fL   MCH 28.0 26.0 - 34.0 pg   MCHC 30.4 30.0 - 36.0 g/dL   RDW 84.1 (H) 66.0 - 63.0 %   Platelets 464 (H) 150 - 400 K/uL   nRBC 0.0  0.0 - 0.2 %    Comment: Performed at [facility], [address] [place], [place] [address]  Magnesium     Status: None   Collection Time: 05/25/19  2:34 AM  Result Value Ref Range   Magnesium 1.9 1.7 - 2.4 mg/dL    Comment: Performed at [facility], [address] [place], [place] [address]  Phosphorus     Status: Abnormal   Collection Time: 05/25/19  2:34 AM  Result Value Ref Range   Phosphorus 5.6 (H) 2.5 - 4.6 mg/dL    Comment: Performed at [facility], [address] [place], [place] [address]  Basic metabolic panel     Status: Abnormal   Collection Time: 05/25/19  2:34 AM  Result Value Ref Range   Sodium 141 135 - 145 mmol/L   Potassium 3.0 (L) 3.5 - 5.1 mmol/L   Chloride 110 98 - 111 mmol/L   CO2 23 22 - 32 mmol/L   Glucose, Bld 126 (H) 70 - 99 mg/dL   BUN 12 6 - 20 mg/dL   Creatinine, Ser <7.25<0.30 (L) 0.61 - 1.24 mg/dL   Calcium 8.3 (L) 8.9 - 10.3 mg/dL   GFR calc non Af Amer NOT CALCULATED >60 mL/min   GFR calc Af Amer NOT CALCULATED >60 mL/min   Anion gap 8 5 - 15    Comment: Performed at [facility], [address] [place], [place] [address]  Glucose, capillary     Status: Abnormal   Collection Time: 05/25/19 12:31 PM  Result Value Ref Range   Glucose-Capillary 100 (H) 70 - 99 mg/dL   Comment 1 Notify RN    Comment 2 Document in Chart   Glucose, capillary     Status: Abnormal   Collection Time: 05/25/19  5:16 PM  Result Value Ref Range   Glucose-Capillary 113 (H) 70 - 99 mg/dL   Comment 1 Notify RN    Comment 2 Document in Chart   Glucose, capillary     Status: Abnormal   Collection Time: 05/25/19  7:59 PM  Result Value Ref Range    Glucose-Capillary 113 (H) 70 - 99 mg/dL  Glucose, capillary     Status: Abnormal   Collection Time: 05/25/19 11:30 PM  Result Value Ref Range   Glucose-Capillary 109 (H) 70 - 99 mg/dL  CBC     Status: Abnormal   Collection Time: 05/26/19  4:36 AM  Result Value Ref Range   WBC 19.4 (H) 4.0 - 10.5 K/uL   RBC 2.67 (L) 4.22 - 5.81 MIL/uL   Hemoglobin 7.1 (L) 13.0 - 17.0 g/dL   HCT 03.424.3 (L) 74.239.0 - 59.552.0 %   MCV 91.0 80.0 - 100.0 fL   MCH 26.6 26.0 - 34.0 pg   MCHC 29.2 (L) 30.0 - 36.0 g/dL   RDW 63.816.8 (H) 75.611.5 - 43.315.5 %   Platelets 372 150 - 400 K/uL   nRBC 0.0 0.0 - 0.2 %    Comment: Performed at [facility], [address] [place], [place] [address]  Basic metabolic panel     Status: Abnormal   Collection Time: 05/26/19  4:36 AM  Result Value Ref Range   Sodium 136 135 - 145 mmol/L   Potassium 3.7 3.5 - 5.1 mmol/L    Comment: DELTA CHECK NOTED REPEATED TO VERIFY NO VISIBLE HEMOLYSIS    Chloride 101 98 - 111 mmol/L   CO2 26 22 - 32 mmol/L  Glucose, Bld 134 (H) 70 - 99 mg/dL   BUN 14 6 - 20 mg/dL   Creatinine, Ser 4.09 (L) 0.61 - 1.24 mg/dL   Calcium 9.0 8.9 - 81.1 mg/dL   GFR calc non Af Amer >60 >60 mL/min   GFR calc Af Amer >60 >60 mL/min   Anion gap 9 5 - 15    Comment: Performed at [facility], [address], [place], [place] [address]  Magnesium     Status: None   Collection Time: 05/26/19  4:36 AM  Result Value Ref Range   Magnesium 2.3 1.7 - 2.4 mg/dL    Comment: Performed at [facility], [address], [place], [place] [address]  Glucose, capillary     Status: Abnormal   Collection Time: 05/26/19  8:07 AM  Result Value Ref Range   Glucose-Capillary 114 (H) 70 - 99 mg/dL   Comment 1 Notify RN    Comment 2 Document in Chart   Glucose, capillary     Status: Abnormal   Collection Time: 05/26/19 11:53 AM  Result Value Ref Range   Glucose-Capillary 110 (H) 70 - 99 mg/dL   Comment 1 Notify RN    Comment 2  Document in Chart    Ct Chest W Contrast  Result Date: 05/25/2019 CLINICAL DATA:  Inpatient. Fever of unknown origin. Quadriplegia. Hypoxia. Stage IV decubitus ulcer. Chronic HCV. EXAM: CT CHEST, ABDOMEN, AND PELVIS WITH CONTRAST TECHNIQUE: Multidetector CT imaging of the chest, abdomen and pelvis was performed following the standard protocol during bolus administration of intravenous contrast. CONTRAST:  OMNIPAQUE IOHEXOL 300 MG/ML  SOLN COMPARISON:  02/23/2019 chest CT angiogram. 04/24/2017 CT abdomen/pelvis. FINDINGS: CT CHEST FINDINGS Cardiovascular: Normal heart size. No significant pericardial effusion/thickening. Atherosclerotic nonaneurysmal thoracic aorta. Normal caliber pulmonary arteries. No convincing central pulmonary emboli. Mediastinum/Nodes: No discrete thyroid nodules. Unremarkable esophagus. No axillary adenopathy. Newly mildly enlarged 1.0 cm subcarinal node (series 2/image 32). No additional pathologically enlarged mediastinal nodes. Enlarged 1.6 cm right hilar node (series 2/image 29), previously 1.6 cm using similar measurement technique, stable. No left hilar adenopathy. Lungs/Pleura: Tracheostomy tube terminates in the tracheal air column just below the thoracic inlet. The central right upper lobe, right middle lobe and entire right lower lobe airways are occluded. There is patchy tree-in-bud opacity in the dependent and basilar right upper lobe and dependent right middle lobe. Complete dense consolidation of the right lower lobe with some associated volume loss. Minimal patchy consolidation is noted in the dependent basilar left lower lobe. No discrete lung masses or significant pulmonary nodules. No pleural effusion. Musculoskeletal: No aggressive appearing focal osseous lesions. Minimal thoracic spondylosis. Advanced right glenohumeral osteoarthritis. CT ABDOMEN PELVIS FINDINGS Hepatobiliary: Normal liver with no liver mass. Normal gallbladder with no radiopaque cholelithiasis.  No biliary ductal dilatation. Pancreas: Normal, with no mass or duct dilation. Spleen: Normal size. No mass. Adrenals/Urinary Tract: Normal adrenals. Normal kidneys with no hydronephrosis and no renal mass. Bladder decompressed by indwelling suprapubic catheter and not well evaluated. Stomach/Bowel: Percutaneous gastrostomy tube is in place in the anterior proximal stomach. No acute gastric abnormality. Normal caliber small bowel. There are 2 short jejunal intussusceptions in the left abdomen (series 2/images 91 and 100). No definite small bowel wall thickening. Oral contrast transits to the distal colon. Appendix not discretely visualized. No pericecal inflammatory changes. Status post subtotal distal colectomy with end sigmoid colostomy in the ventral left abdominal wall with unremarkable Hartmann's pouch. No definite large bowel wall thickening or  significant pericolonic fat stranding. Vascular/Lymphatic: Minimally atherosclerotic nonaneurysmal abdominal aorta. Patent portal, splenic, hepatic and renal veins. No pathologically enlarged lymph nodes in the abdomen or pelvis. Reproductive: Top-normal size prostate. Other: No pneumoperitoneum, ascites or focal fluid collection. Musculoskeletal: No aggressive appearing focal osseous lesions. Advanced degenerative disc disease and bilateral facet arthropathy in the lower lumbar spine. Large lower sacral decubitus ulcer noted extending to the bone with associated sclerosis and bone loss at the sacrococcygeal junction compatible with surgical debridement and/or chronic osteomyelitis. IMPRESSION: 1. Complete dense right lower lobe consolidation. Minimal patchy dependent left lung base consolidation. Patchy tree-in-bud opacities in the right upper and right middle lobes. Central right airways are occluded, presumably by mucoid impaction. Findings are most compatible with multilobar pneumonia predominantly involving the right lower lobe. 2. New mild subcarinal  lymphadenopathy, nonspecific, statistically most likely to be reactive. Stable chronic mild right hilar adenopathy, most compatible with reactive adenopathy. 3. Well-positioned tracheostomy tube, gastrostomy tube and suprapubic bladder catheter. 4. No acute bowel abnormality. No abdominal abscess. Expected postsurgical changes from end sigmoid colostomy. 5. Large lower sacral decubitus ulcer with sclerosis and bone loss at the sacrococcygeal junction compatible with surgical debridement and/or chronic osteomyelitis. 6.  Aortic Atherosclerosis (ICD10-I70.0). Electronically Signed   By: Delbert Phenix M.D.   On: 05/25/2019 17:52   Ct Abdomen Pelvis W Contrast  Result Date: 05/25/2019 CLINICAL DATA:  Inpatient. Fever of unknown origin. Quadriplegia. Hypoxia. Stage IV decubitus ulcer. Chronic HCV. EXAM: CT CHEST, ABDOMEN, AND PELVIS WITH CONTRAST TECHNIQUE: Multidetector CT imaging of the chest, abdomen and pelvis was performed following the standard protocol during bolus administration of intravenous contrast. CONTRAST:  OMNIPAQUE IOHEXOL 300 MG/ML  SOLN COMPARISON:  02/23/2019 chest CT angiogram. 04/24/2017 CT abdomen/pelvis. FINDINGS: CT CHEST FINDINGS Cardiovascular: Normal heart size. No significant pericardial effusion/thickening. Atherosclerotic nonaneurysmal thoracic aorta. Normal caliber pulmonary arteries. No convincing central pulmonary emboli. Mediastinum/Nodes: No discrete thyroid nodules. Unremarkable esophagus. No axillary adenopathy. Newly mildly enlarged 1.0 cm subcarinal node (series 2/image 32). No additional pathologically enlarged mediastinal nodes. Enlarged 1.6 cm right hilar node (series 2/image 29), previously 1.6 cm using similar measurement technique, stable. No left hilar adenopathy. Lungs/Pleura: Tracheostomy tube terminates in the tracheal air column just below the thoracic inlet. The central right upper lobe, right middle lobe and entire right lower lobe airways are occluded. There  is patchy tree-in-bud opacity in the dependent and basilar right upper lobe and dependent right middle lobe. Complete dense consolidation of the right lower lobe with some associated volume loss. Minimal patchy consolidation is noted in the dependent basilar left lower lobe. No discrete lung masses or significant pulmonary nodules. No pleural effusion. Musculoskeletal: No aggressive appearing focal osseous lesions. Minimal thoracic spondylosis. Advanced right glenohumeral osteoarthritis. CT ABDOMEN PELVIS FINDINGS Hepatobiliary: Normal liver with no liver mass. Normal gallbladder with no radiopaque cholelithiasis. No biliary ductal dilatation. Pancreas: Normal, with no mass or duct dilation. Spleen: Normal size. No mass. Adrenals/Urinary Tract: Normal adrenals. Normal kidneys with no hydronephrosis and no renal mass. Bladder decompressed by indwelling suprapubic catheter and not well evaluated. Stomach/Bowel: Percutaneous gastrostomy tube is in place in the anterior proximal stomach. No acute gastric abnormality. Normal caliber small bowel. There are 2 short jejunal intussusceptions in the left abdomen (series 2/images 91 and 100). No definite small bowel wall thickening. Oral contrast transits to the distal colon. Appendix not discretely visualized. No pericecal inflammatory changes. Status post subtotal distal colectomy with end sigmoid colostomy in the ventral left abdominal wall with unremarkable  Hartmann's pouch. No definite large bowel wall thickening or significant pericolonic fat stranding. Vascular/Lymphatic: Minimally atherosclerotic nonaneurysmal abdominal aorta. Patent portal, splenic, hepatic and renal veins. No pathologically enlarged lymph nodes in the abdomen or pelvis. Reproductive: Top-normal size prostate. Other: No pneumoperitoneum, ascites or focal fluid collection. Musculoskeletal: No aggressive appearing focal osseous lesions. Advanced degenerative disc disease and bilateral facet arthropathy  in the lower lumbar spine. Large lower sacral decubitus ulcer noted extending to the bone with associated sclerosis and bone loss at the sacrococcygeal junction compatible with surgical debridement and/or chronic osteomyelitis. IMPRESSION: 1. Complete dense right lower lobe consolidation. Minimal patchy dependent left lung base consolidation. Patchy tree-in-bud opacities in the right upper and right middle lobes. Central right airways are occluded, presumably by mucoid impaction. Findings are most compatible with multilobar pneumonia predominantly involving the right lower lobe. 2. New mild subcarinal lymphadenopathy, nonspecific, statistically most likely to be reactive. Stable chronic mild right hilar adenopathy, most compatible with reactive adenopathy. 3. Well-positioned tracheostomy tube, gastrostomy tube and suprapubic bladder catheter. 4. No acute bowel abnormality. No abdominal abscess. Expected postsurgical changes from end sigmoid colostomy. 5. Large lower sacral decubitus ulcer with sclerosis and bone loss at the sacrococcygeal junction compatible with surgical debridement and/or chronic osteomyelitis. 6.  Aortic Atherosclerosis (ICD10-I70.0). Electronically Signed   By: Delbert Phenix M.D.   On: 05/25/2019 17:52   Dg Chest Port 1 View  Result Date: 05/25/2019 CLINICAL DATA:  Check central line placement EXAM: PORTABLE CHEST 1 VIEW COMPARISON:  05/24/2019 FINDINGS: Cardiac shadows within normal limits. Tracheostomy tube is noted. Right jugular central line is noted in the proximal superior vena cava. Increasing density is noted in the right lung base consistent with evolving atelectasis/early infiltrate. Left lung is clear. No pneumothorax is noted. IMPRESSION: Right jugular central line without pneumothorax. Increased right basilar density consistent with evolving atelectasis/infiltrate. Electronically Signed   By: Alcide Clever M.D.   On: 05/25/2019 02:38   Dg Chest Port 1 View  Result Date:  05/24/2019 CLINICAL DATA:  Pneumonia EXAM: PORTABLE CHEST 1 VIEW COMPARISON:  05/24/2019 FINDINGS: Cardiac shadow is stable. Patient is somewhat rotated. Tracheostomy tube is seen. Lungs are hyperinflated bilaterally. No pneumothorax or sizable effusion is seen. Previously seen left basilar changes have resolved. No bony abnormality is seen. IMPRESSION: No acute abnormality noted. Mild COPD. Electronically Signed   By: Alcide Clever M.D.   On: 05/24/2019 22:46      Assessment/Plan Quadriplegia Liver cirrhosis 2/2 chronic hep c, Prior heroin abuse Prior alcohol abuse Seizure disorder OSA Ventilator dependence Acute on Chronic respiratory failure - Per primary team  Chronic sacral wound  - Wound appears clean. There is no indication for surgical debridement at this time. This does not appear to be the source of the patient's infection. This appears to have been present for some time. Per chart review, has received care for this at [facility] and [facility] in the past - BID WTD dressing changes with DAKINS soaked Kerlix.  - Turn patient  - Hydrotherapy - We will continue to follow.   Jacinto Halim, [facility] Surgery 05/26/2019, 12:24 PM Pager: [phone] Consults: [phone]

## 2019-05-26 NOTE — Progress Notes (Addendum)
Spoke with daughter  Daughter Cody Vaughn [phone]  Updated about ongoing issues  Patient's illness started with spinal cord injury 2019 Had surgery about July 2019, following rehab he was able to walk Decompensated afterwards and required rehospitalization  At home he is stable usually Ventilator dependent Unable to walk Able to interact and eat  Spent a couple of months at [facility] following neck surgery His back wound has been present for a while and was managed previously at [facility] and [facility]  Usually gets his care at [facility]  Other ongoing issues seizure disorder History of cirrhosis History of alcoholism  Usual doctor is Dr. Silvestre Mesi at [facility]  Images of sacral wound on record

## 2019-05-26 NOTE — Progress Notes (Signed)
eLink Physician-Brief Progress Note Patient Name: Cody Vaughn DOB: 20-Jun-1971 MRN: [id number]   Date of Service  05/26/2019  HPI/Events of Note  Called to assess patient due to hypotension and desaturation. Improved with suctioning, nebulization and increasing PEEP to 8. MAP maintained >65. Mentating well.  eICU Interventions  Continue pulmonary toilet Start low dose pressors if needed     Intervention Category Intermediate Interventions: Hypotension - evaluation and management;Respiratory distress - evaluation and management  Darl Pikes 05/26/2019, 5:16 AM
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
## 2019-05-26 NOTE — [place] Notes (Signed)
Subjective:   "Im cold'   Antibiotics:  Anti-infectives (From admission, onward)   Start     Dose/Rate Route Frequency Ordered Stop   05/26/19 1000  ceftolozane-tazobactam (ZERBAXA) 1.5 g in sodium chloride 0.9 % 100 mL IVPB     1.5 g 111.4 mL/hr over 60 Minutes Intravenous Every 8 hours 05/26/19 0907        Medications: Scheduled Meds:  chlorhexidine gluconate (MEDLINE KIT)  15 mL Mouth Rinse BID   Chlorhexidine Gluconate Cloth  6 each Topical Daily   feeding supplement (PRO-STAT SUGAR FREE 64)  30 mL Per Tube Daily   folic acid  1 mg Intravenous Daily   free water  100 mL Per Tube Q4H   gabapentin  100 mg Per Tube Q8H   heparin  5,000 Units Subcutaneous Q8H   ipratropium-albuterol  3 mL Nebulization Q6H   mouth rinse  15 mL Mouth Rinse 10 times per day   midodrine  5 mg Per Tube TID WC   QUEtiapine  50 mg Per Tube Q12H   thiamine injection  100 mg Intravenous Q24H   Continuous Infusions:  sodium chloride 7.5 mL/hr at 05/25/19 0802   sodium chloride     ceftolozane-tazobactam (ZERBAXA) IVPB     famotidine (PEPCID) IV 20 mg (05/26/19 1008)   feeding supplement (VITAL AF 1.2 CAL) 60 mL/hr at 05/26/19 0618   lacosamide (VIMPAT) IV     norepinephrine (LEVOPHED) Adult infusion Stopped (05/25/19 0929)   PRN Meds:.Place/Maintain arterial line **AND** sodium chloride, acetaminophen (TYLENOL) oral liquid 160 mg/5 mL, docusate, fentaNYL (SUBLIMAZE) injection, fentaNYL (SUBLIMAZE) injection, iohexol    Objective: Weight change: 0.1 kg  Intake/Output Summary (Last 24 hours) at 05/26/2019 1042 Last data filed at 05/26/2019 1016 Gross per 24 hour  Intake 480.44 ml  Output 1095 ml  Net -614.56 ml   Blood pressure (!) 98/52, pulse (!) 122, temperature 99.7 F (37.6 C), temperature source Oral, resp. rate (!) 30, height _0  (1.753 m), weight 59.8 kg, SpO2 94 %. Temp:  [98.7 F (37.1 C)-103.1 F (39.5 C)] 99.7 F (37.6 C) (05/29 1000) Pulse  Rate:  [100-124] 122 (05/29 1000) Resp:  [19-38] 30 (05/29 1000) BP: (78-143)/(40-88) 98/52 (05/29 1000) SpO2:  [86 %-100 %] 94 % (05/29 1000) Arterial Line BP: (77-164)/(52-109) 95/80 (05/29 1000) FiO2 (%):  [30 %-60 %] 30 % (05/29 0817) Weight:  [59.8 kg] 59.8 kg (05/29 0500)  Physical Exam: General: Alert and awake, oriented x3,asking for blankets  HEENT: anicteric sclera, EOMI CVS tachycardic, no mgr  Chest: , no wheezing, fairly clear anteriorly Abdomen: soft non-distended,  Extremities: no edema or deformity noted bilaterally Skin: decubitus ulcers not examined Neuro:quadraplegic  CBC:    BMET Recent Labs    05/25/19 0234 05/26/19 0436  NA 141 136  K 3.0* 3.7  CL 110 101  CO2 23 26  GLUCOSE 126* 134*  BUN 12 14  CREATININE <0.30* 0.44*  CALCIUM 8.3* 9.0     Liver Panel  No results for input(s): PROT, ALBUMIN, AST, ALT, ALKPHOS, BILITOT, BILIDIR, IBILI in the last 72 hours.     Sedimentation Rate No results for input(s): ESRSEDRATE in the last 72 hours. C-Reactive Protein No results for input(s): CRP in the last 72 hours.  Micro Results: Recent Results (from the past 720 hour(s))  MRSA PCR Screening     Status: None   Collection Time: 05/24/19  8:59 PM  Result Value Ref Range Status  MRSA by PCR NEGATIVE NEGATIVE Final    Comment:        The GeneXpert MRSA Assay (FDA approved for NASAL specimens only), is one component of a comprehensive MRSA colonization surveillance program. It is not intended to diagnose MRSA infection nor to guide or monitor treatment for MRSA infections. Performed at [facility], [place] [address] [place], [place] [address]   Respiratory Panel by PCR     Status: None   Collection Time: 05/24/19 10:12 PM  Result Value Ref Range Status   Adenovirus NOT DETECTED NOT DETECTED Final   Coronavirus 229E NOT DETECTED NOT DETECTED Final    Comment: (NOTE) The Coronavirus on the Respiratory Panel, DOES NOT  test for the novel  Coronavirus (2019 nCoV)    Coronavirus HKU1 NOT DETECTED NOT DETECTED Final   Coronavirus NL63 NOT DETECTED NOT DETECTED Final   Coronavirus OC43 NOT DETECTED NOT DETECTED Final   Metapneumovirus NOT DETECTED NOT DETECTED Final   Rhinovirus / Enterovirus NOT DETECTED NOT DETECTED Final   Influenza A NOT DETECTED NOT DETECTED Final   Influenza B NOT DETECTED NOT DETECTED Final   Parainfluenza Virus 1 NOT DETECTED NOT DETECTED Final   Parainfluenza Virus 2 NOT DETECTED NOT DETECTED Final   Parainfluenza Virus 3 NOT DETECTED NOT DETECTED Final   Parainfluenza Virus 4 NOT DETECTED NOT DETECTED Final   Respiratory Syncytial Virus NOT DETECTED NOT DETECTED Final   Bordetella pertussis NOT DETECTED NOT DETECTED Final   Chlamydophila pneumoniae NOT DETECTED NOT DETECTED Final   Mycoplasma pneumoniae NOT DETECTED NOT DETECTED Final    Comment: Performed at [facility], [place]. [address], [place], [place] [address]    Studies/Results: Ct Chest W Contrast  Result Date: 05/25/2019 CLINICAL DATA:  Inpatient. Fever of unknown origin. Quadriplegia. Hypoxia. Stage IV decubitus ulcer. Chronic HCV. EXAM: CT CHEST, ABDOMEN, AND PELVIS WITH CONTRAST TECHNIQUE: Multidetector CT imaging of the chest, abdomen and pelvis was performed following the standard protocol during bolus administration of intravenous contrast. CONTRAST:  17m OMNIPAQUE IOHEXOL 300 MG/ML  SOLN COMPARISON:  02/23/2019 chest CT angiogram. 04/24/2017 CT abdomen/pelvis. FINDINGS: CT CHEST FINDINGS Cardiovascular: Normal heart size. No significant pericardial effusion/thickening. Atherosclerotic nonaneurysmal thoracic aorta. Normal caliber pulmonary arteries. No convincing central pulmonary emboli. Mediastinum/Nodes: No discrete thyroid nodules. Unremarkable esophagus. No axillary adenopathy. Newly mildly enlarged 1.0 cm subcarinal node (series 2/image 32). No additional pathologically enlarged mediastinal nodes. Enlarged  1.6 cm right hilar node (series 2/image 29), previously 1.6 cm using similar measurement technique, stable. No left hilar adenopathy. Lungs/Pleura: Tracheostomy tube terminates in the tracheal air column just below the thoracic inlet. The central right upper lobe, right middle lobe and entire right lower lobe airways are occluded. There is patchy tree-in-bud opacity in the dependent and basilar right upper lobe and dependent right middle lobe. Complete dense consolidation of the right lower lobe with some associated volume loss. Minimal patchy consolidation is noted in the dependent basilar left lower lobe. No discrete lung masses or significant pulmonary nodules. No pleural effusion. Musculoskeletal: No aggressive appearing focal osseous lesions. Minimal thoracic spondylosis. Advanced right glenohumeral osteoarthritis. CT ABDOMEN PELVIS FINDINGS Hepatobiliary: Normal liver with no liver mass. Normal gallbladder with no radiopaque cholelithiasis. No biliary ductal dilatation. Pancreas: Normal, with no mass or duct dilation. Spleen: Normal size. No mass. Adrenals/Urinary Tract: Normal adrenals. Normal kidneys with no hydronephrosis and no renal mass. Bladder decompressed by indwelling suprapubic catheter and not well evaluated. Stomach/Bowel: Percutaneous gastrostomy tube is in place  in the anterior proximal stomach. No acute gastric abnormality. Normal caliber small bowel. There are 2 short jejunal intussusceptions in the left abdomen (series 2/images 91 and 100). No definite small bowel wall thickening. Oral contrast transits to the distal colon. Appendix not discretely visualized. No pericecal inflammatory changes. Status post subtotal distal colectomy with end sigmoid colostomy in the ventral left abdominal wall with unremarkable Hartmann's pouch. No definite large bowel wall thickening or significant pericolonic fat stranding. Vascular/Lymphatic: Minimally atherosclerotic nonaneurysmal abdominal aorta. Patent  portal, splenic, hepatic and renal veins. No pathologically enlarged lymph nodes in the abdomen or pelvis. Reproductive: Top-normal size prostate. Other: No pneumoperitoneum, ascites or focal fluid collection. Musculoskeletal: No aggressive appearing focal osseous lesions. Advanced degenerative disc disease and bilateral facet arthropathy in the lower lumbar spine. Large lower sacral decubitus ulcer noted extending to the bone with associated sclerosis and bone loss at the sacrococcygeal junction compatible with surgical debridement and/or chronic osteomyelitis. IMPRESSION: 1. Complete dense right lower lobe consolidation. Minimal patchy dependent left lung base consolidation. Patchy tree-in-bud opacities in the right upper and right middle lobes. Central right airways are occluded, presumably by mucoid impaction. Findings are most compatible with multilobar pneumonia predominantly involving the right lower lobe. 2. New mild subcarinal lymphadenopathy, nonspecific, statistically most likely to be reactive. Stable chronic mild right hilar adenopathy, most compatible with reactive adenopathy. 3. Well-positioned tracheostomy tube, gastrostomy tube and suprapubic bladder catheter. 4. No acute bowel abnormality. No abdominal abscess. Expected postsurgical changes from end sigmoid colostomy. 5. Large lower sacral decubitus ulcer with sclerosis and bone loss at the sacrococcygeal junction compatible with surgical debridement and/or chronic osteomyelitis. 6.  Aortic Atherosclerosis (ICD10-I70.0). Electronically Signed   By: Ilona Sorrel M.D.   On: 05/25/2019 17:52   Ct Abdomen Pelvis W Contrast  Result Date: 05/25/2019 CLINICAL DATA:  Inpatient. Fever of unknown origin. Quadriplegia. Hypoxia. Stage IV decubitus ulcer. Chronic HCV. EXAM: CT CHEST, ABDOMEN, AND PELVIS WITH CONTRAST TECHNIQUE: Multidetector CT imaging of the chest, abdomen and pelvis was performed following the standard protocol during bolus administration  of intravenous contrast. CONTRAST:  129m OMNIPAQUE IOHEXOL 300 MG/ML  SOLN COMPARISON:  02/23/2019 chest CT angiogram. 04/24/2017 CT abdomen/pelvis. FINDINGS: CT CHEST FINDINGS Cardiovascular: Normal heart size. No significant pericardial effusion/thickening. Atherosclerotic nonaneurysmal thoracic aorta. Normal caliber pulmonary arteries. No convincing central pulmonary emboli. Mediastinum/Nodes: No discrete thyroid nodules. Unremarkable esophagus. No axillary adenopathy. Newly mildly enlarged 1.0 cm subcarinal node (series 2/image 32). No additional pathologically enlarged mediastinal nodes. Enlarged 1.6 cm right hilar node (series 2/image 29), previously 1.6 cm using similar measurement technique, stable. No left hilar adenopathy. Lungs/Pleura: Tracheostomy tube terminates in the tracheal air column just below the thoracic inlet. The central right upper lobe, right middle lobe and entire right lower lobe airways are occluded. There is patchy tree-in-bud opacity in the dependent and basilar right upper lobe and dependent right middle lobe. Complete dense consolidation of the right lower lobe with some associated volume loss. Minimal patchy consolidation is noted in the dependent basilar left lower lobe. No discrete lung masses or significant pulmonary nodules. No pleural effusion. Musculoskeletal: No aggressive appearing focal osseous lesions. Minimal thoracic spondylosis. Advanced right glenohumeral osteoarthritis. CT ABDOMEN PELVIS FINDINGS Hepatobiliary: Normal liver with no liver mass. Normal gallbladder with no radiopaque cholelithiasis. No biliary ductal dilatation. Pancreas: Normal, with no mass or duct dilation. Spleen: Normal size. No mass. Adrenals/Urinary Tract: Normal adrenals. Normal kidneys with no hydronephrosis and no renal mass. Bladder decompressed by indwelling suprapubic catheter and not  well evaluated. Stomach/Bowel: Percutaneous gastrostomy tube is in place in the anterior proximal stomach.  No acute gastric abnormality. Normal caliber small bowel. There are 2 short jejunal intussusceptions in the left abdomen (series 2/images 91 and 100). No definite small bowel wall thickening. Oral contrast transits to the distal colon. Appendix not discretely visualized. No pericecal inflammatory changes. Status post subtotal distal colectomy with end sigmoid colostomy in the ventral left abdominal wall with unremarkable Hartmann's pouch. No definite large bowel wall thickening or significant pericolonic fat stranding. Vascular/Lymphatic: Minimally atherosclerotic nonaneurysmal abdominal aorta. Patent portal, splenic, hepatic and renal veins. No pathologically enlarged lymph nodes in the abdomen or pelvis. Reproductive: Top-normal size prostate. Other: No pneumoperitoneum, ascites or focal fluid collection. Musculoskeletal: No aggressive appearing focal osseous lesions. Advanced degenerative disc disease and bilateral facet arthropathy in the lower lumbar spine. Large lower sacral decubitus ulcer noted extending to the bone with associated sclerosis and bone loss at the sacrococcygeal junction compatible with surgical debridement and/or chronic osteomyelitis. IMPRESSION: 1. Complete dense right lower lobe consolidation. Minimal patchy dependent left lung base consolidation. Patchy tree-in-bud opacities in the right upper and right middle lobes. Central right airways are occluded, presumably by mucoid impaction. Findings are most compatible with multilobar pneumonia predominantly involving the right lower lobe. 2. New mild subcarinal lymphadenopathy, nonspecific, statistically most likely to be reactive. Stable chronic mild right hilar adenopathy, most compatible with reactive adenopathy. 3. Well-positioned tracheostomy tube, gastrostomy tube and suprapubic bladder catheter. 4. No acute bowel abnormality. No abdominal abscess. Expected postsurgical changes from end sigmoid colostomy. 5. Large lower sacral decubitus  ulcer with sclerosis and bone loss at the sacrococcygeal junction compatible with surgical debridement and/or chronic osteomyelitis. 6.  Aortic Atherosclerosis (ICD10-I70.0). Electronically Signed   By: Ilona Sorrel M.D.   On: 05/25/2019 17:52   Dg Chest Port 1 View  Result Date: 05/25/2019 CLINICAL DATA:  Check central line placement EXAM: PORTABLE CHEST 1 VIEW COMPARISON:  05/24/2019 FINDINGS: Cardiac shadows within normal limits. Tracheostomy tube is noted. Right jugular central line is noted in the proximal superior vena cava. Increasing density is noted in the right lung base consistent with evolving atelectasis/early infiltrate. Left lung is clear. No pneumothorax is noted. IMPRESSION: Right jugular central line without pneumothorax. Increased right basilar density consistent with evolving atelectasis/infiltrate. Electronically Signed   By: Inez Catalina M.D.   On: 05/25/2019 02:38   Dg Chest Port 1 View  Result Date: 05/24/2019 CLINICAL DATA:  Pneumonia EXAM: PORTABLE CHEST 1 VIEW COMPARISON:  05/24/2019 FINDINGS: Cardiac shadow is stable. Patient is somewhat rotated. Tracheostomy tube is seen. Lungs are hyperinflated bilaterally. No pneumothorax or sizable effusion is seen. Previously seen left basilar changes have resolved. No bony abnormality is seen. IMPRESSION: No acute abnormality noted. Mild COPD. Electronically Signed   By: Inez Catalina M.D.   On: 05/24/2019 22:46      Assessment/Plan:  INTERVAL HISTORY: CT showing multilobar PNA, mucous plugging and fevers overnight  Active Problems:   Acute on chronic respiratory failure with hypoxia (HCC)   Septic shock (HCC)   Encounter for central line placement   Suprapubic catheter ([place])   Ventilator dependence ([place])   Sacral wound   Pressure injury of skin    Cody Vaughn is a 48 y.o. 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 Johnnye Sima is available for questions over the weekend.   LOS: 2 days   Alcide Evener 05/26/2019, 10:42 AM

## 2019-05-27 ENCOUNTER — Inpatient Hospital Stay (HOSPITAL_COMMUNITY): Payer: Medicaid Other

## 2019-05-27 LAB — CBC WITH DIFFERENTIAL/PLATELET
Abs Immature Granulocytes: 0.09 10*3/uL — ABNORMAL HIGH (ref 0.00–0.07)
Basophils Absolute: 0 10*3/uL (ref 0.0–0.1)
Basophils Relative: 0 %
Eosinophils Absolute: 0.1 10*3/uL (ref 0.0–0.5)
Eosinophils Relative: 0 %
HCT: 22.5 % — ABNORMAL LOW (ref 39.0–52.0)
Hemoglobin: 6.6 g/dL — CL (ref 13.0–17.0)
Immature Granulocytes: 1 %
Lymphocytes Relative: 9 %
Lymphs Abs: 1.3 10*3/uL (ref 0.7–4.0)
MCH: 26.8 pg (ref 26.0–34.0)
MCHC: 29.3 g/dL — ABNORMAL LOW (ref 30.0–36.0)
MCV: 91.5 fL (ref 80.0–100.0)
Monocytes Absolute: 0.9 10*3/uL (ref 0.1–1.0)
Monocytes Relative: 7 %
Neutro Abs: 11.7 10*3/uL — ABNORMAL HIGH (ref 1.7–7.7)
Neutrophils Relative %: 83 %
Platelets: 333 10*3/uL (ref 150–400)
RBC: 2.46 MIL/uL — ABNORMAL LOW (ref 4.22–5.81)
RDW: 16.2 % — ABNORMAL HIGH (ref 11.5–15.5)
WBC: 14 10*3/uL — ABNORMAL HIGH (ref 4.0–10.5)
nRBC: 0 % (ref 0.0–0.2)

## 2019-05-27 LAB — PREPARE RBC (CROSSMATCH)

## 2019-05-27 LAB — GLUCOSE, CAPILLARY
Glucose-Capillary: 102 mg/dL — ABNORMAL HIGH (ref 70–99)
Glucose-Capillary: 109 mg/dL — ABNORMAL HIGH (ref 70–99)
Glucose-Capillary: 120 mg/dL — ABNORMAL HIGH (ref 70–99)
Glucose-Capillary: 151 mg/dL — ABNORMAL HIGH (ref 70–99)
Glucose-Capillary: 93 mg/dL (ref 70–99)
Glucose-Capillary: 98 mg/dL (ref 70–99)

## 2019-05-27 LAB — BASIC METABOLIC PANEL
Anion gap: 7 (ref 5–15)
BUN: 12 mg/dL (ref 6–20)
CO2: 25 mmol/L (ref 22–32)
Calcium: 8.6 mg/dL — ABNORMAL LOW (ref 8.9–10.3)
Chloride: 104 mmol/L (ref 98–111)
Creatinine, Ser: 0.36 mg/dL — ABNORMAL LOW (ref 0.61–1.24)
GFR calc Af Amer: >60 mL/min (ref >60)
GFR calc non Af Amer: >60 mL/min (ref >60)
Glucose, Bld: 96 mg/dL (ref 70–99)
Potassium: 3.5 mmol/L (ref 3.5–5.1)
Sodium: 136 mmol/L (ref 135–145)

## 2019-05-27 MED ORDER — SODIUM CHLORIDE 0.9% FLUSH: 10.0000 mL | INTRAVENOUS | Status: DC | PRN

## 2019-05-27 MED ORDER — SODIUM CHLORIDE 0.9% FLUSH
10.0000 mL | Freq: Two times a day (BID) | INTRAVENOUS | Status: DC
  Administered 2019-05-27: 22:00:00 30 mL
  Administered 2019-05-27 – 2019-05-28 (×2): 10 mL
  Administered 2019-05-28: 20 mL
  Administered 2019-05-29 – 2019-06-01 (×7): 10 mL

## 2019-05-27 MED ORDER — SODIUM CHLORIDE 0.9% IV SOLUTION
Freq: Once | INTRAVENOUS | Status: AC
  Administered 2019-05-27: 17:00:00 via INTRAVENOUS

## 2019-05-27 MED ORDER — ZOLPIDEM TARTRATE 10 MG PO TABS
10.0000 mg | ORAL_TABLET | Freq: Every day | ORAL | Status: DC
  Administered 2019-05-27 – 2019-05-31 (×4): 10 mg via ORAL
  Filled 2019-05-27 (×4): qty 1

## 2019-05-27 MED ORDER — CHLORHEXIDINE GLUCONATE CLOTH 2 % EX PADS
6.0000 | MEDICATED_PAD | Freq: Every day | CUTANEOUS | Status: DC
  Administered 2019-05-27 – 2019-05-31 (×5): 6 via TOPICAL

## 2019-05-27 MED ORDER — SODIUM CHLORIDE 0.9 % IV BOLUS
500.0000 mL | Freq: Once | INTRAVENOUS | Status: AC
  Administered 2019-05-27: 500 mL via INTRAVENOUS

## 2019-05-27 MED ORDER — MIDODRINE HCL 5 MG PO TABS
10.0000 mg | ORAL_TABLET | Freq: Three times a day (TID) | ORAL | Status: DC
  Administered 2019-05-27 – 2019-05-28 (×5): 10 mg
  Filled 2019-05-27 (×5): qty 2
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
## 2019-05-27 NOTE — Progress Notes (Signed)
PT Cancellation Note  Patient Details Name: Cody Vaughn MRN: [id number] DOB: 14-Apr-1971   Cancelled Treatment:    Reason Eval/Treat Not Completed: PT/Hydrotherapy screened, no needs identified, will sign off;  Wound is clean, absent of necrotic tissue  and therefore hydrotherapy intervention is not warranted at this time. Thank you  Drucilla Chalet, PT  Pager: [phone] Acute Rehab Dept [facility]): [phone]   05/27/2019    [facility] 05/27/2019, 10:25 AM

## 2019-05-28 LAB — GLUCOSE, CAPILLARY
Glucose-Capillary: 93 mg/dL (ref 70–99)
Glucose-Capillary: 106 mg/dL — ABNORMAL HIGH (ref 70–99)
Glucose-Capillary: 100 mg/dL — ABNORMAL HIGH (ref 70–99)
Glucose-Capillary: 114 mg/dL — ABNORMAL HIGH (ref 70–99)
Glucose-Capillary: 132 mg/dL — ABNORMAL HIGH (ref 70–99)

## 2019-05-28 LAB — HEMOGLOBIN AND HEMATOCRIT, BLOOD
HCT: 25.4 % — ABNORMAL LOW (ref 39.0–52.0)
Hemoglobin: 7.7 g/dL — ABNORMAL LOW (ref 13.0–17.0)

## 2019-05-28 LAB — BPAM RBC
Blood Product Expiration Date: 202006122359
ISSUE DATE / TIME: 202005301657
Unit Type and Rh: 6200

## 2019-05-28 LAB — TYPE AND SCREEN
ABO/RH(D): A POS
Antibody Screen: NEGATIVE
Unit division: 0

## 2019-05-28 LAB — ABO/RH: ABO/RH(D): A POS

## 2019-05-28 MED ORDER — LACOSAMIDE 50 MG PO TABS
100.0000 mg | ORAL_TABLET | Freq: Two times a day (BID) | ORAL | Status: DC
  Administered 2019-05-28 – 2019-06-01 (×8): 100 mg via ORAL
  Filled 2019-05-28 (×8): qty 2

## 2019-05-28 MED ORDER — ACETAMINOPHEN 325 MG PO TABS: 650.0000 mg | ORAL_TABLET | Freq: Four times a day (QID) | ORAL | Status: DC | PRN

## 2019-05-28 MED ORDER — FAMOTIDINE 20 MG PO TABS
20.0000 mg | ORAL_TABLET | Freq: Two times a day (BID) | ORAL | Status: DC
  Administered 2019-05-28 – 2019-06-01 (×8): 20 mg via ORAL
  Filled 2019-05-28 (×8): qty 1

## 2019-05-28 MED ORDER — FOLIC ACID 1 MG PO TABS
1.0000 mg | ORAL_TABLET | Freq: Every day | ORAL | Status: DC
  Administered 2019-05-29 – 2019-06-01 (×4): 1 mg via ORAL
  Filled 2019-05-28 (×4): qty 1

## 2019-05-28 MED ORDER — VITAMIN B-1 100 MG PO TABS
100.0000 mg | ORAL_TABLET | Freq: Every day | ORAL | Status: DC
  Administered 2019-05-28 – 2019-06-01 (×5): 100 mg via ORAL
  Filled 2019-05-28 (×5): qty 1

## 2019-05-28 MED ORDER — QUETIAPINE FUMARATE 50 MG PO TABS
50.0000 mg | ORAL_TABLET | Freq: Two times a day (BID) | ORAL | Status: DC
  Administered 2019-05-28 – 2019-06-01 (×8): 50 mg via ORAL
  Filled 2019-05-28 (×8): qty 1

## 2019-05-28 MED ORDER — GABAPENTIN 100 MG PO CAPS
100.0000 mg | ORAL_CAPSULE | Freq: Three times a day (TID) | ORAL | Status: DC
  Administered 2019-05-28 – 2019-06-01 (×14): 100 mg via ORAL
  Filled 2019-05-28 (×14): qty 1

## 2019-05-28 MED ORDER — ADULT MULTIVITAMIN W/MINERALS CH
1.0000 | ORAL_TABLET | Freq: Every day | ORAL | Status: DC
  Administered 2019-05-28 – 2019-06-01 (×5): 1 via ORAL
  Filled 2019-05-28 (×5): qty 1

## 2019-05-28 MED ORDER — FOLIC ACID 1 MG PO TABS
1.0000 mg | ORAL_TABLET | Freq: Every day | ORAL | Status: DC
  Administered 2019-05-28: 1 mg
  Filled 2019-05-28: qty 1

## 2019-05-28 MED ORDER — MIDODRINE HCL 5 MG PO TABS
10.0000 mg | ORAL_TABLET | Freq: Three times a day (TID) | ORAL | Status: DC
  Administered 2019-05-28 – 2019-06-01 (×14): 10 mg via ORAL
  Filled 2019-05-28 (×14): qty 2

## 2019-05-28 MED ORDER — METHOCARBAMOL 500 MG PO TABS
1000.0000 mg | ORAL_TABLET | Freq: Three times a day (TID) | ORAL | Status: DC
  Administered 2019-05-28 – 2019-06-01 (×14): 1000 mg via ORAL
  Filled 2019-05-28 (×14): qty 2

## 2019-05-28 MED ORDER — SODIUM CHLORIDE 0.9 % IV SOLN
100.0000 mg | Freq: Once | INTRAVENOUS | Status: AC
  Administered 2019-05-28: 100 mg via INTRAVENOUS
  Filled 2019-05-28: qty 10
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
## 2019-05-29 ENCOUNTER — Other Ambulatory Visit: Payer: Self-pay

## 2019-05-29 ENCOUNTER — Inpatient Hospital Stay (HOSPITAL_COMMUNITY): Payer: Medicaid Other

## 2019-05-29 DIAGNOSIS — Z1624 Resistance to multiple antibiotics: Secondary | ICD-10-CM

## 2019-05-29 DIAGNOSIS — Z880 Allergy status to penicillin: Secondary | ICD-10-CM

## 2019-05-29 DIAGNOSIS — B965 Pseudomonas (aeruginosa) (mallei) (pseudomallei) as the cause of diseases classified elsewhere: Secondary | ICD-10-CM

## 2019-05-29 DIAGNOSIS — Z930 Tracheostomy status: Secondary | ICD-10-CM

## 2019-05-29 DIAGNOSIS — J189 Pneumonia, unspecified organism: Secondary | ICD-10-CM | POA: Diagnosis present

## 2019-05-29 LAB — BASIC METABOLIC PANEL
Anion gap: 8 (ref 5–15)
BUN: 13 mg/dL (ref 6–20)
CO2: 23 mmol/L (ref 22–32)
Calcium: 8.7 mg/dL — ABNORMAL LOW (ref 8.9–10.3)
Chloride: 104 mmol/L (ref 98–111)
Creatinine, Ser: 0.35 mg/dL — ABNORMAL LOW (ref 0.61–1.24)
GFR calc Af Amer: >60 mL/min (ref >60)
GFR calc non Af Amer: >60 mL/min (ref >60)
Glucose, Bld: 111 mg/dL — ABNORMAL HIGH (ref 70–99)
Potassium: 3.8 mmol/L (ref 3.5–5.1)
Sodium: 135 mmol/L (ref 135–145)

## 2019-05-29 LAB — IRON AND TIBC
Iron: 25 ug/dL — ABNORMAL LOW (ref 45–182)
Saturation Ratios: 9 % — ABNORMAL LOW (ref 17.9–39.5)
TIBC: 269 ug/dL (ref 250–450)
UIBC: 244 ug/dL

## 2019-05-29 LAB — GLUCOSE, CAPILLARY
Glucose-Capillary: 104 mg/dL — ABNORMAL HIGH (ref 70–99)
Glucose-Capillary: 106 mg/dL — ABNORMAL HIGH (ref 70–99)
Glucose-Capillary: 119 mg/dL — ABNORMAL HIGH (ref 70–99)

## 2019-05-29 LAB — CBC
HCT: 25.5 % — ABNORMAL LOW (ref 39.0–52.0)
Hemoglobin: 7.4 g/dL — ABNORMAL LOW (ref 13.0–17.0)
MCH: 26.9 pg (ref 26.0–34.0)
MCHC: 29 g/dL — ABNORMAL LOW (ref 30.0–36.0)
MCV: 92.7 fL (ref 80.0–100.0)
Platelets: 339 10*3/uL (ref 150–400)
RBC: 2.75 MIL/uL — ABNORMAL LOW (ref 4.22–5.81)
RDW: 16 % — ABNORMAL HIGH (ref 11.5–15.5)
WBC: 8.2 10*3/uL (ref 4.0–10.5)
nRBC: 0 % (ref 0.0–0.2)

## 2019-05-29 LAB — FERRITIN: Ferritin: 100 ng/mL (ref 24–336)

## 2019-05-29 MED ORDER — PRO-STAT SUGAR FREE PO LIQD
30.0000 mL | Freq: Every day | ORAL | Status: DC
  Administered 2019-05-29 – 2019-06-01 (×4): 30 mL via ORAL
  Filled 2019-05-29 (×4): qty 30

## 2019-05-29 MED ORDER — ENSURE ENLIVE PO LIQD
237.0000 mL | ORAL | Status: DC
  Administered 2019-05-29 – 2019-06-01 (×2): 237 mL via ORAL

## 2019-05-29 MED ORDER — JUVEN PO PACK
1.0000 | PACK | Freq: Two times a day (BID) | ORAL | Status: DC
  Administered 2019-05-29 – 2019-05-31 (×4): 1 via ORAL
  Filled 2019-05-29 (×8): qty 1

## 2019-05-29 MED ORDER — LORAZEPAM 1 MG PO TABS
1.0000 mg | ORAL_TABLET | Freq: Once | ORAL | Status: AC
  Administered 2019-05-29: 1 mg via ORAL
  Filled 2019-05-29: qty 1

## 2019-05-29 NOTE — Progress Notes (Addendum)
Nutrition Follow-up  RD working remotely.   DOCUMENTATION CODES:   Not applicable  INTERVENTION:  - will order Ensure Enlive once/day, each supplement provides 350 kcal and 20 grams of protein. - will order Juven BID, each packet provides 90 calories, 2.5 grams of protein, 8 grams of carbohydrate, and 14 grams of amino acids; supplement contains CaHMB, glutamine, and arginine, to promote wound healing. - will order 30 mL Prostat once/day, each supplement provides 100 kcal and 15 grams of protein. - continue to encourage PO intakes.    NUTRITION DIAGNOSIS:   Increased nutrient needs related to wound healing, acute illness(sepsis) as evidenced by estimated needs. -ongoing  GOAL:   Patient will meet greater than or equal to 90% of their needs -unmet at this time  MONITOR:   PO intake, Supplement acceptance, Labs, Weight trends, Skin  ASSESSMENT:   48 year-old male with hx of quadriplegia, MDR Pseudomonas infection, OSA, chronic hepatitis C, cirrhosis 2/2 alcohol abuse, and seizures. Patient presented from [facility], where he had arrived d/t fever and worsening SOB which began the evening of 5/26. Patient with trach and is vent dependent at baseline with known frequent mucous plugging. COVID-19 negative at [facility]. On arrival to [facility], he was connected to vent, able to speak over vent and stated that he had a fever which has now passed and chills that started 1 day ago. He felt as if he was unable to breath despite O2 sats of 95%. 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 Patient admitted with sepsis 2/2 PNA.  Patient on vent via trach. Diet advanced from NPO to Soft on 5/29 at 12:20 PM and then to Regular on 5/31 at 10:15 AM. Per RN flow sheet, he consumed 100% of dinner on 5/29 and 5/30 and 100% of breakfast this AM. TF was discontinued with diet advancement (patient had been receiving  Vital AF 1.2 @ 60 ml/hr with 30 ml prostat once/day which provided 1828 kcal, 123 grams protein, and 1168 ml free water).   Per notes: Surgery is following and has indicated no need for surgical debridement of coccyx wound, acute on chronic respiratory failure in the setting of HCAP, possible transition back to home vent on 6/2.  Notes earlier in admission had indicated patient with septic shock. Sepsis and/or sepsis shock no longer mentioned in notes for this patient so will order juven supplement (which contains arginine).     Patient is currently intubated on ventilator support MV: 12.6 L/min as of 8:30 AM Temp (24hrs), Avg:98.8 F (37.1 C), Min:97.7 F (36.5 C), Max:99.9 F (37.7 C)   Medications reviewed; 20 mg oral pepcid BID, 1 mg folvite/day, 100 mg free water per G-tube every 4 hours, daily multivitamin with minerals, 100 mg oral thiamine/day.  Labs reviewed; CBGs: 104, 119, and 106 mg/dl today, creatinine: 6.44 mg/dl, Ca: 8.7 mg/dl.     Diet Order:   Diet Order            Diet regular Room service appropriate? Yes; Fluid consistency: Thin  Diet effective now              EDUCATION NEEDS:   No education needs have been identified at this time  Skin:  Skin Assessment: Skin Integrity Issues: Skin Integrity Issues:: Stage IV Stage IV: coccyx with wound vac  Last BM:  6/1  Height:   Ht Readings from  Last 1 Encounters:  05/24/19 5\' 9"  (1.753 m)    Weight:   Wt Readings from Last 1 Encounters:  05/28/19 60.8 kg    Ideal Body Weight:  65.4 kg  BMI:  Body mass index is 19.79 kg/m.  Estimated Nutritional Needs:   Kcal:  [phone] kcal  Protein:  90-105 grams  Fluid:  >/= 2 L/day     Trenton GammonJessica Gad Aymond, MS, RD, LDN, [facility] Inpatient Clinical Dietitian Pager # [phone] After hours/weekend pager # [phone]

## 2019-05-29 NOTE — Progress Notes (Signed)
Pt daughter Hospital doctor left message with tech for RN to call her and update her.  RN needed to go into pts room for a while so charge nurse called pts daughter Joice Lofts but she did not pick up.  RN will ask day shift physician and RN to reach out to daughter in the morning

## 2019-05-29 NOTE — Progress Notes (Signed)
Patient ID: Cody Vaughn, male   DOB: 26-Sep-1971, 48 y.o.   MRN: [id number]         [facility]  Date of Admission:  05/24/2019           Day 4 ceftolozane tazobactam ASSESSMENT: He has recurrent ventilator associated pneumonia with a densely consolidated right lower lobe.  Sputum culture growing a multidrug resistant Pseudomonas.  He does seem to be improving clinically.  His fevers and white blood cell count are coming down.  PLAN: 1. Continue ceftolozane tazobactam  Principal Problem:   HCAP (healthcare-associated pneumonia) Active Problems:   Acute on chronic respiratory failure with hypoxia (HCC)   Septic shock (HCC)   Encounter for central line placement   Suprapubic catheter (HCC)   Ventilator dependence ([place])   Sacral wound   Pressure injury of skin   Scheduled Meds:  chlorhexidine gluconate (MEDLINE KIT)  15 mL Mouth Rinse BID   Chlorhexidine Gluconate Cloth  6 each Topical Daily   diclofenac sodium  2 g Topical QID   famotidine  20 mg Oral BID   feeding supplement (ENSURE ENLIVE)  237 mL Oral Q24H   feeding supplement (PRO-STAT SUGAR FREE 64)  30 mL Oral Daily   folic acid  1 mg Oral Daily   free water  100 mL Per Tube Q4H   gabapentin  100 mg Oral TID   heparin  5,000 Units Subcutaneous Q8H   ipratropium-albuterol  3 mL Nebulization Q6H   lacosamide  100 mg Oral BID   mouth rinse  15 mL Mouth Rinse 10 times per day   methocarbamol  1,000 mg Oral TID   midodrine  10 mg Oral TID WC   multivitamin with minerals  1 tablet Oral Daily   nutrition supplement (JUVEN)  1 packet Oral BID BM   QUEtiapine  50 mg Oral Q12H   sodium chloride flush  10-40 mL Intracatheter Q12H   thiamine  100 mg Oral Daily   zolpidem  10 mg Oral QHS   Continuous Infusions:  sodium chloride 7.5 mL/hr at 05/25/19 0802   ceftolozane-tazobactam (ZERBAXA) IVPB Stopped (05/29/19 1030)   PRN Meds:.acetaminophen, fentaNYL (SUBLIMAZE)  injection, sodium chloride flush   SUBJECTIVE: He indicates that he is feeling a little bit better.  Review of Systems: Review of Systems  Constitutional: Negative for chills, diaphoresis and fever.    Allergies  Allergen Reactions   Penicillins Other (See Comments)    Daughter isn't sure what kind of reaction her father had with penicillin    OBJECTIVE: Vitals:   05/29/19 1400 05/29/19 1500 05/29/19 1558 05/29/19 1607  BP: (!) 107/54     Pulse: (!) 103 80    Resp: (!) 30 (!) 30    Temp:   98.6 F (37 C)   TempSrc:   Oral   SpO2: 94% 97%  94%  Weight:      Height:       Body mass index is 19.79 kg/m.  Physical Exam Constitutional:      Comments: He is resting quietly in bed watching television.  Cardiovascular:     Rate and Rhythm: Normal rate and regular rhythm.     Heart sounds: No murmur.  Pulmonary:     Comments: His lungs are clear anteriorly.  Has a tracheostomy tube and remains on the ventilator.    Lab Results Lab Results  Component Value Date   WBC 8.2 05/29/2019   HGB 7.4 (L) 05/29/2019  HCT 25.5 (L) 05/29/2019   MCV 92.7 05/29/2019   PLT 339 05/29/2019    Lab Results  Component Value Date   CREATININE 0.35 (L) 05/29/2019   BUN 13 05/29/2019   NA 135 05/29/2019   K 3.8 05/29/2019   CL 104 05/29/2019   CO2 23 05/29/2019   No results found for: ALT, AST, GGT, ALKPHOS, BILITOT   Microbiology: Recent Results (from the past 240 hour(s))  MRSA PCR Screening     Status: None   Collection Time: 05/24/19  8:59 PM  Result Value Ref Range Status   MRSA by PCR NEGATIVE NEGATIVE Final    Comment:        The GeneXpert MRSA Assay (FDA approved for NASAL specimens only), is one component of a comprehensive MRSA colonization surveillance program. It is not intended to diagnose MRSA infection nor to guide or monitor treatment for MRSA infections. Performed at [facility], [place] [address], [place], [place] [address]     Respiratory Panel by PCR     Status: None   Collection Time: 05/24/19 10:12 PM  Result Value Ref Range Status   Adenovirus NOT DETECTED NOT DETECTED Final   Coronavirus 229E NOT DETECTED NOT DETECTED Final    Comment: (NOTE) The Coronavirus on the Respiratory Panel, DOES NOT test for the novel  Coronavirus (2019 nCoV)    Coronavirus HKU1 NOT DETECTED NOT DETECTED Final   Coronavirus NL63 NOT DETECTED NOT DETECTED Final   Coronavirus OC43 NOT DETECTED NOT DETECTED Final   Metapneumovirus NOT DETECTED NOT DETECTED Final   Rhinovirus / Enterovirus NOT DETECTED NOT DETECTED Final   Influenza A NOT DETECTED NOT DETECTED Final   Influenza B NOT DETECTED NOT DETECTED Final   Parainfluenza Virus 1 NOT DETECTED NOT DETECTED Final   Parainfluenza Virus 2 NOT DETECTED NOT DETECTED Final   Parainfluenza Virus 3 NOT DETECTED NOT DETECTED Final   Parainfluenza Virus 4 NOT DETECTED NOT DETECTED Final   Respiratory Syncytial Virus NOT DETECTED NOT DETECTED Final   Bordetella pertussis NOT DETECTED NOT DETECTED Final   Chlamydophila pneumoniae NOT DETECTED NOT DETECTED Final   Mycoplasma pneumoniae NOT DETECTED NOT DETECTED Final    Comment: Performed at [facility], [place]. [address], [place], [place] [address]  Culture, blood (routine x 2)     Status: None (Preliminary result)   Collection Time: 05/24/19 10:43 PM  Result Value Ref Range Status   Specimen Description   Final    BLOOD ELINE Performed at [place] [address], [place], [place] [address]    Special Requests   Final    BOTTLES DRAWN AEROBIC AND ANAEROBIC Blood Culture adequate volume Performed at [place] [address], [place], [place] [address]    Culture   Final    NO GROWTH 4 DAYS Performed at [facility], [place] [address], [place], [place] [address]    Report Status PENDING  Incomplete  Culture, blood (routine x 2)     Status: None (Preliminary result)    Collection Time: 05/24/19 10:43 PM  Result Value Ref Range Status   Specimen Description   Final    BLOOD CENTRAL LINE Performed at [facility], [place] [address] [place], [place] [address]    Special Requests   Final    BOTTLES DRAWN AEROBIC AND ANAEROBIC Blood Culture results may not be optimal due to an excessive volume of blood received in culture bottles Performed  at [facility], [place] [address] [place], [place] [address]    Culture   Final    NO GROWTH 4 DAYS Performed at [facility], [place] [address] [place], [place] [address]    Report Status PENDING  Incomplete  Culture, respiratory (non-expectorated)     Status: None (Preliminary result)   Collection Time: 05/26/19  9:33 AM  Result Value Ref Range Status   Specimen Description   Final    TRACHEAL ASPIRATE Performed at [place] [address], [place], [place] [address]    Special Requests   Final    NONE Performed at [facility], [place] [address] [place], [place] [address]    Gram Stain   Final    ABUNDANT WBC PRESENT, PREDOMINANTLY PMN FEW GRAM POSITIVE RODS    Culture   Final    FEW PSEUDOMONAS AERUGINOSA CULTURE REINCUBATED FOR BETTER GROWTH Performed at [facility], [place] [address] [place], [place] [address]    Report Status PENDING  Incomplete   Organism ID, Bacteria PSEUDOMONAS AERUGINOSA  Final      Susceptibility   Pseudomonas aeruginosa - MIC*    CEFTAZIDIME 16 INTERMEDIATE Intermediate     CIPROFLOXACIN 2 INTERMEDIATE Intermediate     GENTAMICIN 2 SENSITIVE Sensitive     IMIPENEM >=16 RESISTANT Resistant     CEFEPIME 16 INTERMEDIATE Intermediate     * FEW PSEUDOMONAS AERUGINOSA    Michel Bickers, MD [facility] [phone] pager   [phone] cell 05/29/2019, 4:53 PM

## 2019-05-29 NOTE — Progress Notes (Signed)
Pt quite restless, not able to rest.  C/o of pain in sacral wound area.  Requested pain medicine.  Fentanyl given.  Pt resting quietly, arouses easily, but falls back asleep.  Continue to turn every two hours, but pt does not want to be turned onto his right side.  Pt educated about the importantce of rotating side to side to get pressure off of his woulnd.  Pt states he understands

## 2019-05-29 NOTE — Progress Notes (Signed)
Antimicrobial Stewardship - Brief Note (susceptibility testing)  MDR pseudomonas aeruginosa isolated from trach aspirate.  Has h/o of MDRO in past.  Called microbiology lab and will send susc for the following:  Piperacillin/tazobactam (Vitek machine resulted in error) Ceftolozane/tazobactam - current tx Ceftazidime/avibactam  Juliette Alcide, PharmD, BCPS.   Work Cell: [phone] 05/29/2019 10:44 AM
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
## 2019-05-29 NOTE — Evaluation (Signed)
SLP Cancellation Note  Patient Details Name: BENTLIE MANSKER MRN: [id number] DOB: 1971-12-09   Cancelled Treatment:     Received order for PMSV on vent, at this time unfortunately our department is awaiting shipment of vent valves.  Will coordinate with RT as soon as able. Thanks for this order and SLP apologizes for delay in services.   [name], MS [facility] SLP Acute Rehab Services Pager [phone] Office [phone]   [name] 05/29/2019, 4:00 PM

## 2019-05-29 NOTE — Final Consult Note (Signed)
Consultant Final Sign-Off Note    Assessment/Final recommendations  Cody Vaughn is a 48 y.o. 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     Wound care (if applicable): Twice daily wet to dry dressing changes with normal saline dampened kerlex. Apply dry ABD pad on top and secure with tape, or apply pink pad over moist kerlex   Diet at discharge: per primary team   Activity at discharge: per primary team   Follow-up appointment:  Follow up with outpatient wound clinic   Pending results:  Unresulted Labs (From admission, onward)    Start     Ordered   05/24/19 2212  Culture, respiratory (non-expectorated)  ONCE - STAT,   R    Question:  Patient immune status  Answer:  Normal   05/24/19 2211           Medication recommendations:   Other recommendations: Wound is very clean, no recommendations for surgical intervention. Continue good wound care as above. Continue to frequently turn patient. General surgery will sign off, please call us with any concerns.     Franne FortsBrooke A Meuth 05/29/2019 7:54 AM    Subjective  Per RN patient slept well last night. He did have some bleeding from sacral wound during last dressing change.  Objective  Vital signs in last 24 hours: Temp:  [97.7 F (36.5 C)-99.1 F (37.3 C)] 98.9 F (37.2 C) (06/01 0000) Pulse Rate:  [63-104] 94 (06/01 0600) Resp:  [23-36] 30 (06/01 0600) BP: (65-181)/(42-101) 102/60 (06/01 0600) SpO2:  [94 %-100 %] 94 % (06/01 0600) FiO2 (%):  [40 %] 40 % (06/01 0343)  Physical exam: Gen: alert, chronically ill appearing HEENT: trach, EOMs intact, pupils equal and round Pulm: on the vent Skin: warm and dry Abd: colostomy present, PEG tube in place, abdomen soft  and NT GU: Sacral wound beefy red with minimal fibrinous exudate, no significant tunneling or purulent drainage noted, no cellulitis, no active bleeding     Pertinent labs and Studies: Recent Labs    05/27/19 0538 05/28/19 0718 05/29/19 0645  WBC 14.0*  --  8.2  HGB 6.6* 7.7* 7.4*  HCT 22.5* 25.4* 25.5*   BMET Recent Labs    05/27/19 0538 05/29/19 0645  NA 136 135  K 3.5 3.8  CL 104 104  CO2 25 23  GLUCOSE 96 111*  BUN 12 13  CREATININE 0.36* 0.35*  CALCIUM 8.6* 8.7*   No results for input(s): LABURIN in the last 72 hours. Results for orders placed or performed during the hospital encounter of 05/24/19  MRSA PCR Screening     Status: None   Collection Time: 05/24/19  8:59 PM  Result Value Ref Range Status   MRSA by PCR NEGATIVE NEGATIVE Final    Comment:        The GeneXpert MRSA Assay (FDA approved for NASAL specimens only), is one component of a comprehensive MRSA colonization surveillance program. It is not intended to diagnose MRSA infection nor to guide or monitor treatment for MRSA infections. Performed at [facility], [address] [place], [place] [id number]   Respiratory Panel by PCR     Status: None   Collection Time: 05/24/19 10:12 PM  Result Value Ref Range Status   Adenovirus  NOT DETECTED NOT DETECTED Final   Coronavirus 229E NOT DETECTED NOT DETECTED Final    Comment: (NOTE) The Coronavirus on the Respiratory Panel, DOES NOT test for the novel  Coronavirus (2019 nCoV)    Coronavirus HKU1 NOT DETECTED NOT DETECTED Final   Coronavirus NL63 NOT DETECTED NOT DETECTED Final   Coronavirus OC43 NOT DETECTED NOT DETECTED Final   Metapneumovirus NOT DETECTED NOT DETECTED Final   Rhinovirus / Enterovirus NOT DETECTED NOT DETECTED Final   Influenza A NOT DETECTED NOT DETECTED Final   Influenza B NOT DETECTED NOT DETECTED Final   Parainfluenza Virus 1 NOT DETECTED NOT DETECTED Final   Parainfluenza Virus 2 NOT DETECTED NOT  DETECTED Final   Parainfluenza Virus 3 NOT DETECTED NOT DETECTED Final   Parainfluenza Virus 4 NOT DETECTED NOT DETECTED Final   Respiratory Syncytial Virus NOT DETECTED NOT DETECTED Final   Bordetella pertussis NOT DETECTED NOT DETECTED Final   Chlamydophila pneumoniae NOT DETECTED NOT DETECTED Final   Mycoplasma pneumoniae NOT DETECTED NOT DETECTED Final    Comment: Performed at [facility], [address] [place], [place] [address]  Culture, blood (routine x 2)     Status: None (Preliminary result)   Collection Time: 05/24/19 10:43 PM  Result Value Ref Range Status   Specimen Description   Final    BLOOD ELINE Performed at [facility], [address] [place], [place] [address]    Special Requests   Final    BOTTLES DRAWN AEROBIC AND ANAEROBIC Blood Culture adequate volume Performed at [facility], [address] [place], [place] [address]    Culture   Final    NO GROWTH 4 DAYS Performed at [facility], [address], [place], [place] [address]    Report Status PENDING  Incomplete  Culture, blood (routine x 2)     Status: None (Preliminary result)   Collection Time: 05/24/19 10:43 PM  Result Value Ref Range Status   Specimen Description   Final    BLOOD CENTRAL LINE Performed at [facility], [address] [place], [place] [address]    Special Requests   Final    BOTTLES DRAWN AEROBIC AND ANAEROBIC Blood Culture results may not be optimal due to an excessive volume of blood received in culture bottles Performed at [facility], [address], [place], [place] [address]    Culture   Final    NO GROWTH 4 DAYS Performed at [facility], [address] [place], [place] [address]    Report Status PENDING  Incomplete  Culture, respiratory (non-expectorated)     Status: None (Preliminary result)   Collection Time: 05/26/19  9:33 AM  Result Value Ref Range Status   Specimen  Description   Final    TRACHEAL ASPIRATE Performed at [facility], [address], [place], [place] [address]    Special Requests   Final    NONE Performed at [facility], [address], [place], [place] [address]    Gram Stain   Final    ABUNDANT WBC PRESENT, PREDOMINANTLY PMN FEW GRAM POSITIVE RODS    Culture   Final    FEW PSEUDOMONAS AERUGINOSA SUSCEPTIBILITIES TO FOLLOW Performed at [facility], [address] [place], [place] [address]    Report Status PENDING  Incomplete    Imaging: Dg Chest Port 1 View  Result Date: 05/29/2019 CLINICAL DATA:  Respiratory  failure EXAM: PORTABLE CHEST 1 VIEW COMPARISON:  05/27/2019 FINDINGS: Cardiac shadow is stable. Tracheostomy tube and right-sided jugular central line are again seen and stable. Multiple skin folds are noted over the right chest. Right basilar atelectasis is again identified and stable. No new focal bony abnormality is noted. IMPRESSION: Stable right basilar atelectasis. Electronically Signed   By: Alcide Clever M.D.   On: 05/29/2019 07:24

## 2019-05-30 DIAGNOSIS — J189 Pneumonia, unspecified organism: Secondary | ICD-10-CM

## 2019-05-30 LAB — BASIC METABOLIC PANEL
Anion gap: 8 (ref 5–15)
BUN: 19 mg/dL (ref 6–20)
CO2: 27 mmol/L (ref 22–32)
Calcium: 9.1 mg/dL (ref 8.9–10.3)
Chloride: 103 mmol/L (ref 98–111)
Creatinine, Ser: 0.37 mg/dL — ABNORMAL LOW (ref 0.61–1.24)
GFR calc Af Amer: >60 mL/min (ref >60)
GFR calc non Af Amer: >60 mL/min (ref >60)
Glucose, Bld: 105 mg/dL — ABNORMAL HIGH (ref 70–99)
Potassium: 3.8 mmol/L (ref 3.5–5.1)
Sodium: 138 mmol/L (ref 135–145)

## 2019-05-30 LAB — CBC
HCT: 28 % — ABNORMAL LOW (ref 39.0–52.0)
Hemoglobin: 8.4 g/dL — ABNORMAL LOW (ref 13.0–17.0)
MCH: 27.5 pg (ref 26.0–34.0)
MCHC: 30 g/dL (ref 30.0–36.0)
MCV: 91.8 fL (ref 80.0–100.0)
Platelets: 392 10*3/uL (ref 150–400)
RBC: 3.05 MIL/uL — ABNORMAL LOW (ref 4.22–5.81)
RDW: 15.7 % — ABNORMAL HIGH (ref 11.5–15.5)
WBC: 8.3 10*3/uL (ref 4.0–10.5)
nRBC: 0 % (ref 0.0–0.2)

## 2019-05-30 LAB — CULTURE, BLOOD (ROUTINE X 2)
Culture: NO GROWTH
Culture: NO GROWTH
Special Requests: ADEQUATE

## 2019-05-30 MED ORDER — FERROUS SULFATE 325 (65 FE) MG PO TABS
325.0000 mg | ORAL_TABLET | Freq: Two times a day (BID) | ORAL | Status: DC
  Administered 2019-05-30 – 2019-06-01 (×5): 325 mg via ORAL
  Filled 2019-05-30 (×5): qty 1

## 2019-05-30 MED ORDER — VITAMIN C 500 MG PO TABS
250.0000 mg | ORAL_TABLET | Freq: Two times a day (BID) | ORAL | Status: DC
  Administered 2019-05-30 – 2019-06-01 (×5): 250 mg via ORAL
  Filled 2019-05-30 (×5): qty 1

## 2019-05-30 MED FILL — Medication: Qty: 1 | Status: AC

## 2019-05-30 NOTE — Progress Notes (Signed)
PT sleeping- no CPT at this time.

## 2019-05-30 NOTE — Progress Notes (Addendum)
Patient ID: Cody Vaughn, male   DOB: [birth year], 48 y.o.   MRN: [id number]          [facility] for Infectious Disease    Date of Admission:  05/24/2019   Day 5 ceftolozane tazobactam         Cody Vaughn remains afebrile and seems to be improving on therapy for a multidrug-resistant Pseudomonas pneumonia.  His CT scan showed dense consolidation of his right lower lobe.  I recommend at least 10 days of effective therapy.  I asked Jeri Modena of [facility] to check on the feasibility of ceftolozane tazobactam outpatient IV therapy at home.  They do stock the drug and his Medicaid co-pay would only be $0.43 per day.         Cliffton Asters, MD [facility] for Infectious Disease [facility] [phone] pager   [phone] cell 05/30/2019, 11:26 AM
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
## 2019-05-30 NOTE — Evaluation (Signed)
Occupational Therapy Evaluation Patient Details Name: Cody Vaughn MRN: [id number] DOB: December 06, 1971 Today's Date: 05/30/2019    History of Present Illness 48 yo quadriplegic M w/ PMHx sig for MDR Pseudomonas infection, OSA, chronic Hep C w/ cirrhosis and seizures. Pt presents from [facility] per the EDP documentation the pt complained of  fever and worsening SOB that began on the evening of 5/26.  Pt is vent dept w/ frequent mucous plugging at his baseline.  COVID test negative. He was transferred to [facility] ICU.   Clinical Impression   Pt admitted with the above Pt currently with functional limitations due to the deficits listed below (see OT Problem List).  Pt will benefit from skilled OT to increase their safety and independence with ADL and functional mobility for ADL to facilitate discharge to venue listed below.   Pt would like to be able to A with self feeding.  Pt has been able todo this in the past but not at this time.     Follow Up Recommendations  Home health OT;SNF(depending on progress)    Equipment Recommendations  None recommended by OT       Precautions / Restrictions Precautions Precaution Comments: pt is quad      Mobility Bed Mobility Overal bed mobility: Needs Assistance             General bed mobility comments: dependent  Transfers                 General transfer comment: did not perform - would need lift        ADL either performed or assessed with clinical judgement   ADL Overall ADL's : Needs assistance/impaired                                       General ADL Comments: dependent other than self feeding per pt     Vision Patient Visual Report: No change from baseline       Perception     Praxis      Pertinent Vitals/Pain Pain Assessment: Faces Faces Pain Scale: Hurts little more Pain Location: L shoulder with PROM Pain Descriptors / Indicators: Grimacing Pain Intervention(s): Limited activity within  patient's tolerance     Hand Dominance     Extremity/Trunk Assessment Upper Extremity Assessment Upper Extremity Assessment: RUE deficits/detail;LUE deficits/detail RUE Deficits / Details: pt is a quad. Reports splints at home.  Spoke with RN regarding asking daugther to bring these. Pt reports being able to self feed with the last few monthes.  Pt would not be able to do this at this time           Communication  Very difficult as pt is on vent   Cognition Arousal/Alertness: Awake/alert Behavior During Therapy: WFL for tasks assessed/performed Overall Cognitive Status: Within Functional Limits for tasks assessed                                 General Comments: Pt very hard to understand.  Pt whispers.  Speech planning in trying PMV with him.   General Comments       Exercises  PROM performed BUE.  Pt able to A in gravity elimated position ( shoulder and elbow)        Home Living Family/patient expects to be discharged to:: Private residence Living Arrangements: Children  Available Help at Discharge: Family Type of Home: House                                           OT Problem List: Decreased strength;Decreased activity tolerance;Impaired UE functional use;Decreased knowledge of use of DME or AE      OT Treatment/Interventions: Self-care/ADL training;Therapeutic activities;DME and/or AE instruction    OT Goals(Current goals can be found in the care plan section) Acute Rehab OT Goals Patient Stated Goal: feed self OT Goal Formulation: With patient Time For Goal Achievement: 06/06/19  OT Frequency: Min 2X/week    AM-PAC OT "6 Clicks" Daily Activity     Outcome Measure Help from another person eating meals?: Total Help from another person taking care of personal grooming?: Total Help from another person toileting, which includes using toliet, bedpan, or urinal?: Total Help from another person bathing (including washing, rinsing,  drying)?: Total Help from another person to put on and taking off regular upper body clothing?: Total Help from another person to put on and taking off regular lower body clothing?: Total 6 Click Score: 6   End of Session Nurse Communication: Other (comment)(need for hand splints from home)  Activity Tolerance: Patient tolerated treatment well Patient left: in bed  OT Visit Diagnosis: Muscle weakness (generalized) (M62.81)                Time: [id number] OT Time Calculation (min): 14 min Charges:  OT General Charges $OT Visit: 1 Visit OT Evaluation $OT Eval Moderate Complexity: 1 Mod  Cody Vaughn, OT Acute Rehabilitation Services [phone]- [phone] Office- [phone]     Cody Vaughn, Cody Vaughn 05/30/2019, 6:19 PM

## 2019-05-30 NOTE — Progress Notes (Signed)
SLP Cancellation Note  Patient Details Name: SAUNDERS HEINZMAN MRN: [id number] DOB: Sep 24, 1971   Cancelled treatment:        Called RT from [facility] this am to coordinate time for inline vent PMV but asked this SLP to call when got to [facility]. RT stated department is unable to accommodate inline PMV evaluation today. Will continue efforts.    Royce Macadamia 05/30/2019, 12:48 PM   Breck Coons Lonell Face.Ed Nurse, children's [phone] Office [phone]

## 2019-05-30 NOTE — Progress Notes (Signed)
PT Cancellation Note  Patient Details Name: Cody Vaughn MRN: [id number] DOB: 1971/07/12   Cancelled Treatment:    Reason Eval/Treat Not Completed: PT screened, no needs identified, will sign off. Spoke with RN who reports pt it total care and at his baseline. Pt is not appropriate for PT. Will sign off.   Rebeca Alert, PT Acute Rehabilitation Services Pager: [phone] Office: [phone]

## 2019-05-31 ENCOUNTER — Inpatient Hospital Stay: Payer: Self-pay

## 2019-05-31 DIAGNOSIS — Z95828 Presence of other vascular implants and grafts: Secondary | ICD-10-CM

## 2019-05-31 DIAGNOSIS — L89004 Pressure ulcer of unspecified elbow, stage 4: Secondary | ICD-10-CM

## 2019-05-31 LAB — CBC
HCT: 26.6 % — ABNORMAL LOW (ref 39.0–52.0)
Hemoglobin: 8 g/dL — ABNORMAL LOW (ref 13.0–17.0)
MCH: 27.8 pg (ref 26.0–34.0)
MCHC: 30.1 g/dL (ref 30.0–36.0)
MCV: 92.4 fL (ref 80.0–100.0)
Platelets: 379 10*3/uL (ref 150–400)
RBC: 2.88 MIL/uL — ABNORMAL LOW (ref 4.22–5.81)
RDW: 16 % — ABNORMAL HIGH (ref 11.5–15.5)
WBC: 10.2 10*3/uL (ref 4.0–10.5)
nRBC: 0 % (ref 0.0–0.2)

## 2019-05-31 LAB — MISC LABCORP TEST (SEND OUT): Labcorp test code: 88005

## 2019-05-31 LAB — BASIC METABOLIC PANEL
Anion gap: 6 (ref 5–15)
BUN: 15 mg/dL (ref 6–20)
CO2: 26 mmol/L (ref 22–32)
Calcium: 9 mg/dL (ref 8.9–10.3)
Chloride: 105 mmol/L (ref 98–111)
Creatinine, Ser: 0.3 mg/dL — ABNORMAL LOW (ref 0.61–1.24)
GFR calc Af Amer: >60 mL/min (ref >60)
GFR calc non Af Amer: >60 mL/min (ref >60)
Glucose, Bld: 111 mg/dL — ABNORMAL HIGH (ref 70–99)
Potassium: 3.7 mmol/L (ref 3.5–5.1)
Sodium: 137 mmol/L (ref 135–145)

## 2019-05-31 MED ORDER — SODIUM CHLORIDE 0.9% FLUSH
10.0000 mL | Freq: Two times a day (BID) | INTRAVENOUS | Status: DC
  Administered 2019-05-31 – 2019-06-01 (×3): 10 mL

## 2019-05-31 MED ORDER — CHLORHEXIDINE GLUCONATE CLOTH 2 % EX PADS: 6.0000 | MEDICATED_PAD | Freq: Every day | CUTANEOUS | Status: DC

## 2019-05-31 MED ORDER — SODIUM CHLORIDE 0.9% FLUSH: 10.0000 mL | INTRAVENOUS | Status: DC | PRN

## 2019-05-31 NOTE — Progress Notes (Signed)
Occupational Therapy Treatment Patient Details Name: Cody Vaughn MRN: [id number] DOB: 15-Jan-1971 Today's Date: 05/31/2019    History of present illness 48 yo quadriplegic M w/ PMHx sig for MDR Pseudomonas infection, OSA, chronic Hep C w/ cirrhosis and seizures. Pt presents from [facility] per the EDP documentation the pt complained of  fever and worsening SOB that began on the evening of 5/26.  Pt is vent dept w/ frequent mucous plugging at his baseline.  COVID test negative. He was transferred to [facility] ICU.   OT comments  Pt was unable to move either UE today when arms were supported.  He has recently lost ability to participate in self feeding and this is his goal.  Family is unable to bring splints, and I'm not sure if they assisted with self feeding.  Pt was sleepy; will try to return when he is more awake to see if movement is improved.     Follow Up Recommendations  Home health OT(vs no follow up, depending on progress)    Equipment Recommendations  None recommended by OT    Recommendations for Other Services      Precautions / Restrictions Precautions Precaution Comments: pt is quad       Mobility Bed Mobility                  Transfers                      Balance                                           ADL either performed or assessed with clinical judgement   ADL                                         General ADL Comments: brought u cuff/u cuff splint to try for self feeding. Per RN, pt was assisting with self feeding but lost the ability a few months ago.  At this time, pt could not elicit any AROM.  Noted, he does have tenodesis.  Family is unable to bring splints as they are too far away. OT who performed eval did note some AROM. Will try again to see if I got him at a bad time.      Vision       Perception     Praxis      Cognition Arousal/Alertness: (in/out ) Behavior During Therapy: WFL for tasks  assessed/performed Overall Cognitive Status: Within Functional Limits for tasks assessed                                          Exercises Exercises: Other exercises Other Exercises Other Exercises: PROM L shoulder, but spasmed, so stopped, bil elbows and bil wrists with tenodesis grip   Shoulder Instructions       General Comments      Pertinent Vitals/ Pain       Pain Assessment: Faces Faces Pain Scale: Hurts whole lot Pain Location: L shoulder appeared to spasm Pain Descriptors / Indicators: Grimacing  Home Living  Prior Functioning/Environment              Frequency  Min 2X/week        Progress Toward Goals  OT Goals(current goals can now be found in the care plan section)  Progress towards OT goals: Not progressing toward goals - comment     Plan      Co-evaluation                 AM-PAC OT "6 Clicks" Daily Activity     Outcome Measure   Help from another person eating meals?: Total Help from another person taking care of personal grooming?: Total Help from another person toileting, which includes using toliet, bedpan, or urinal?: Total Help from another person bathing (including washing, rinsing, drying)?: Total Help from another person to put on and taking off regular upper body clothing?: Total Help from another person to put on and taking off regular lower body clothing?: Total 6 Click Score: 6    End of Session    OT Visit Diagnosis: Muscle weakness (generalized) (M62.81)   Activity Tolerance Patient limited by fatigue   Patient Left in bed   Nurse Communication          Time: [id number] OT Time Calculation (min): 10 min  Charges: OT General Charges $OT Visit: 1 Visit OT Treatments $Therapeutic Exercise: 8-22 mins  Marica OtterMaryellen Lindia Garms, OTR/L Acute Rehabilitation Services [phone] pager [phone]  office 05/31/2019   Destenie Ingber 05/31/2019, 4:13 PM

## 2019-05-31 NOTE — Progress Notes (Signed)
PROGRESS NOTE    AUTHER LYERLY  YTK:[id number] DOB: [birth year] DOA: 05/24/2019 PCP: Patient, No Pcp Per    Brief Narrative:  48 year old male who presented with fever and worsening dyspnea for 4 hours.  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  Patient has chronic ventilator dependent respiratory failure.  On his initial physical examination he was hemodynamically stable, temperature 37.4 C, he was connected to the ventilator required an FiO2 60%, PEEP of 8.  His lungs are coarse breath sounds, wheezing and rhonchi, heart S1-S2 present and rhythmic, abdomen soft nontender, no lower extremity edema.  His extremities were contracted.  SARS COVID-19 negative ([facility]), his initial chest radiograph had hyperinflation with no infiltrates, subsequent radiographs showed right lower lobe infiltrate confirmed by CT chest.  EKG was 144 bpm, normal axis, normal intervals, sinus rhythm with no ST segment or T wave changes.  Patient was admitted to the hospital with acute on chronic hypoxic respiratory failure complicated by septic shock due to right lower lobe pneumonia.   Assessment & Plan:   Principal Problem:   HCAP (healthcare-associated pneumonia) Active Problems:   Acute on chronic respiratory failure with hypoxia ([place])   Septic shock ([place])   Encounter for central line placement   Suprapubic catheter ([place])   Ventilator dependence ([place])   Sacral wound   Pressure injury of skin   1. Acute on chronic hypoxic respiratory failure due to right lower lobe pneumonia. MDRD pseudomonas, will continue antibiotic therapy with Zerbaxa per ID. Will consult IV team to get peripheral team, possible dc central line. Continue to have low oxygen requirements on ventilatory has been on:  PRVC: Fi02 30%, RR 30, Peep 5, Vt 450 ml. Plan to use his home ventilator before discharge. Check swallow evaluation and continue aspiration  precautions.   2. Stage 4 sacral decubitus ulcer. Continue local wound care.  3. Quadriparesis. Very poor prognosis, will continue supportive care, physical therapy as tolerated.   4. Chronic anemia. Multifactorial, continue close follow up of cell count.   5. Moderate protein calorie malnutrition. Continue nutritional supplements.   DVT prophylaxis: enoxaparin   Code Status: full Family Communication: no family at the bedside Disposition Plan/ discharge barriers: pending clinical improvement.   Body mass index is 20.22 kg/m. Malnutrition Type:  Nutrition Problem: Increased nutrient needs Etiology: wound healing, acute illness(sepsis)   Malnutrition Characteristics:  Signs/Symptoms: estimated needs   Nutrition Interventions:  Interventions: Ensure Enlive (each supplement provides 350kcal and 20 grams of protein), Prostat, Juven  RN Pressure Injury Documentation: Pressure Injury 05/25/19 Stage IV - Full thickness tissue loss with exposed bone, tendon or muscle. Wound vac removed per order (Active)  05/25/19 0845  Location: Coccyx  Location Orientation: Lower;Mid  Staging: Stage IV - Full thickness tissue loss with exposed bone, tendon or muscle.  Wound Description (Comments): Wound vac removed per order  Present on Admission: Yes     Consultants:   Pulmonary.   Procedures:     Antimicrobials:       Subjective: Patient with no chest pain or dyspnea this am. Has been tolerating po with no nausea or vomiting. Continue on mandatory mode mechanical ventilation. At home on home vent.   Objective: Vitals:   05/31/19 0400 05/31/19 0449 05/31/19 0500 05/31/19 0609  BP: (!) 100/49  133/87 96/76  Pulse: 85  92 80  Resp: (!) 30  (!) 26 (!) 23  Temp: 98.9 F (37.2 C)  TempSrc: Oral     SpO2: 96%  95% 92%  Weight:  62.1 kg    Height:        Intake/Output Summary (Last 24 hours) at 05/31/2019 0750 Last data filed at 05/31/2019 0600 Gross per 24 hour  Intake  786.16 ml  Output 5515 ml  Net -4728.84 ml   Filed Weights   05/28/19 0330 05/30/19 0500 05/31/19 0449  Weight: 60.8 kg 62.2 kg 62.1 kg    Examination:   General: deconditioned  Neurology: Awake and alert, non focal, trach in place.   E ENT: mild pallor, no icterus, oral mucosa moist Cardiovascular: No JVD. S1-S2 present, rhythmic, no gallops, rubs, or murmurs. + pitting dependent  lower extremity edema. Pulmonary: positive breath sounds bilaterally, decreased air movement, no wheezing, rhonchi or rales. Decreased breath sounds at bases, anterior auscultation.  Gastrointestinal. Abdomen with colostomy bag in place, no organomegaly, non tender, no rebound or guarding/ suprapubic catheter.  Skin. No rashes Musculoskeletal: no joint deformities     Data Reviewed: I have personally reviewed following labs and imaging studies  CBC: Recent Labs  Lab 05/26/19 0436 05/27/19 0538 05/28/19 0718 05/29/19 0645 05/30/19 0517 05/31/19 0421  WBC 19.4* 14.0*  --  8.2 8.3 10.2  NEUTROABS  --  11.7*  --   --   --   --   HGB 7.1* 6.6* 7.7* 7.4* 8.4* 8.0*  HCT 24.3* 22.5* 25.4* 25.5* 28.0* 26.6*  MCV 91.0 91.5  --  92.7 91.8 92.4  PLT 372 333  --  339 392 355   Basic Metabolic Panel: Recent Labs  Lab 05/25/19 0234 05/26/19 0436 05/27/19 0538 05/29/19 0645 05/30/19 0517 05/31/19 0421  NA 141 136 136 135 138 137  K 3.0* 3.7 3.5 3.8 3.8 3.7  CL 110 101 104 104 103 105  CO2 23 26 25 23 27 26   GLUCOSE 126* 134* 96 111* 105* 111*  BUN 12 14 12 13 19 15   CREATININE <0.30* 0.44* 0.36* 0.35* 0.37* 0.30*  CALCIUM 8.3* 9.0 8.6* 8.7* 9.1 9.0  MG 1.9 2.3  --   --   --   --   PHOS 5.6*  --   --   --   --   --    GFR: Estimated Creatinine Clearance: 98.1 mL/min (A) (by C-G formula based on SCr of 0.3 mg/dL (L)). Liver Function Tests: No results for input(s): AST, ALT, ALKPHOS, BILITOT, PROT, ALBUMIN in the last 168 hours. No results for input(s): LIPASE, AMYLASE in the last 168 hours.  No results for input(s): AMMONIA in the last 168 hours. Coagulation Profile: No results for input(s): INR, PROTIME in the last 168 hours. Cardiac Enzymes: No results for input(s): CKTOTAL, CKMB, CKMBINDEX, TROPONINI in the last 168 hours. BNP (last 3 results) No results for input(s): PROBNP in the last 8760 hours. HbA1C: No results for input(s): HGBA1C in the last 72 hours. CBG: Recent Labs  Lab 05/28/19 1613 05/28/19 1933 05/29/19 0009 05/29/19 0400 05/29/19 0838  GLUCAP 114* 132* 104* 119* 106*   Lipid Profile: No results for input(s): CHOL, HDL, LDLCALC, TRIG, CHOLHDL, LDLDIRECT in the last 72 hours. Thyroid Function Tests: No results for input(s): TSH, T4TOTAL, FREET4, T3FREE, THYROIDAB in the last 72 hours. Anemia Panel: Recent Labs    05/29/19 1000  FERRITIN 100  TIBC 269  IRON 25*      Radiology Studies: I have reviewed all of the imaging during this hospital visit personally     Scheduled Meds: .  chlorhexidine gluconate (MEDLINE KIT)  15 mL Mouth Rinse BID  . Chlorhexidine Gluconate Cloth  6 each Topical Daily  . diclofenac sodium  2 g Topical QID  . famotidine  20 mg Oral BID  . feeding supplement (ENSURE ENLIVE)  237 mL Oral Q24H  . feeding supplement (PRO-STAT SUGAR FREE 64)  30 mL Oral Daily  . ferrous sulfate  325 mg Oral BID WC  . folic acid  1 mg Oral Daily  . free water  100 mL Per Tube Q4H  . gabapentin  100 mg Oral TID  . heparin  5,000 Units Subcutaneous Q8H  . ipratropium-albuterol  3 mL Nebulization Q6H  . lacosamide  100 mg Oral BID  . mouth rinse  15 mL Mouth Rinse 10 times per day  . methocarbamol  1,000 mg Oral TID  . midodrine  10 mg Oral TID WC  . multivitamin with minerals  1 tablet Oral Daily  . nutrition supplement (JUVEN)  1 packet Oral BID BM  . QUEtiapine  50 mg Oral Q12H  . sodium chloride flush  10-40 mL Intracatheter Q12H  . thiamine  100 mg Oral Daily  . vitamin C  250 mg Oral BID  . zolpidem  10 mg Oral QHS    Continuous Infusions: . sodium chloride 7.5 mL/hr at 05/25/19 0802  . ceftolozane-tazobactam (ZERBAXA) IVPB Stopped (05/31/19 0305)     LOS: 7 days        Mauricio Gerome Apley, MD
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
## 2019-05-31 NOTE — Progress Notes (Signed)
Patient ID: Cody Vaughn, male   DOB: 1971/12/11, 48 y.o.   MRN: [id number]         [facility]  Date of Admission:  05/24/2019           Day 6 ceftolozane tazobactam ASSESSMENT: He has recurrent ventilator associated pneumonia with a densely consolidated right lower lobe.  Sputum culture grew a multidrug resistant Pseudomonas.  He has improved clinically.   PLAN: 1. Continue ceftolozane tazobactam for 4 more days  Diagnosis: Healthcare associated pneumonia  Culture Result: Multidrug-resistant pseudomonas aeruginosa  Allergies  Allergen Reactions  . Penicillins Other (See Comments)    Daughter isn't sure what kind of reaction her father had with penicillin    OPAT Orders Discharge antibiotics: Per pharmacy protocol ceftolozane tazobactam  Duration: 10 days End Date: 06/05/2019  [facility] Care Per Protocol:  Labs weekly while on IV antibiotics: _x_ CBC with differential _x_ BMP __ CMP __ CRP __ ESR __ Vancomycin trough __ CK  _x_ Please pull PIC at completion of IV antibiotics __ Please leave PIC in place until doctor has seen patient or been notified  Fax weekly labs to [phone]  Clinic Follow Up Appt: As needed  Principal Problem:   HCAP (healthcare-associated pneumonia) Active Problems:   Acute on chronic respiratory failure with hypoxia ([place])   Septic shock ([place])   Encounter for central line placement   Suprapubic catheter ([place])   Ventilator dependence ([place])   Sacral wound   Pressure injury of skin   Scheduled Meds: . chlorhexidine gluconate (MEDLINE KIT)  15 mL Mouth Rinse BID  . Chlorhexidine Gluconate Cloth  6 each Topical Daily  . Chlorhexidine Gluconate Cloth  6 each Topical Daily  . diclofenac sodium  2 g Topical QID  . famotidine  20 mg Oral BID  . feeding supplement (ENSURE ENLIVE)  237 mL Oral Q24H  . feeding supplement (PRO-STAT SUGAR FREE 64)  30 mL Oral Daily  . ferrous sulfate  325 mg Oral BID WC   . folic acid  1 mg Oral Daily  . free water  100 mL Per Tube Q4H  . gabapentin  100 mg Oral TID  . heparin  5,000 Units Subcutaneous Q8H  . ipratropium-albuterol  3 mL Nebulization Q6H  . lacosamide  100 mg Oral BID  . mouth rinse  15 mL Mouth Rinse 10 times per day  . methocarbamol  1,000 mg Oral TID  . midodrine  10 mg Oral TID WC  . multivitamin with minerals  1 tablet Oral Daily  . nutrition supplement (JUVEN)  1 packet Oral BID BM  . QUEtiapine  50 mg Oral Q12H  . sodium chloride flush  10-40 mL Intracatheter Q12H  . sodium chloride flush  10-40 mL Intracatheter Q12H  . thiamine  100 mg Oral Daily  . vitamin C  250 mg Oral BID  . zolpidem  10 mg Oral QHS   Continuous Infusions: . sodium chloride Stopped (05/31/19 0954)  . ceftolozane-tazobactam (ZERBAXA) IVPB Stopped (05/31/19 1054)   PRN Meds:.acetaminophen, fentaNYL (SUBLIMAZE) injection, sodium chloride flush, sodium chloride flush  Review of Systems: Review of Systems  Unable to perform ROS: Intubated    Allergies  Allergen Reactions  . Penicillins Other (See Comments)    Daughter isn't sure what kind of reaction her father had with penicillin    OBJECTIVE: Vitals:   05/31/19 1200 05/31/19 1250 05/31/19 1352 05/31/19 1500  BP:      Pulse: 98  Resp: (!) 30     Temp:  98.8 F (37.1 C)    TempSrc:  Oral    SpO2: 97%  100%   Weight:      Height:    _0  (1.753 m)   Body mass index is 20.22 kg/m.  Physical Exam Constitutional:      Comments: He is sleeping quietly in bed.  Cardiovascular:     Rate and Rhythm: Normal rate and regular rhythm.     Heart sounds: No murmur.  Pulmonary:     Breath sounds: Normal breath sounds.     Comments: His lungs are clear anteriorly.  Has a tracheostomy tube and remains on the ventilator. Skin:    Comments: New right arm PICC.     Lab Results Lab Results  Component Value Date   WBC 10.2 05/31/2019   HGB 8.0 (L) 05/31/2019   HCT 26.6 (L) 05/31/2019   MCV  92.4 05/31/2019   PLT 379 05/31/2019    Lab Results  Component Value Date   CREATININE 0.30 (L) 05/31/2019   BUN 15 05/31/2019   NA 137 05/31/2019   K 3.7 05/31/2019   CL 105 05/31/2019   CO2 26 05/31/2019   No results found for: ALT, AST, GGT, ALKPHOS, BILITOT   Microbiology: Recent Results (from the past 240 hour(s))  MRSA PCR Screening     Status: None   Collection Time: 05/24/19  8:59 PM  Result Value Ref Range Status   MRSA by PCR NEGATIVE NEGATIVE Final    Comment:        The GeneXpert MRSA Assay (FDA approved for NASAL specimens only), is one component of a comprehensive MRSA colonization surveillance program. It is not intended to diagnose MRSA infection nor to guide or monitor treatment for MRSA infections. Performed at [facility], [place] [address], [place], [place] [address]   Respiratory Panel by PCR     Status: None   Collection Time: 05/24/19 10:12 PM  Result Value Ref Range Status   Adenovirus NOT DETECTED NOT DETECTED Final   Coronavirus 229E NOT DETECTED NOT DETECTED Final    Comment: (NOTE) The Coronavirus on the Respiratory Panel, DOES NOT test for the novel  Coronavirus (2019 nCoV)    Coronavirus HKU1 NOT DETECTED NOT DETECTED Final   Coronavirus NL63 NOT DETECTED NOT DETECTED Final   Coronavirus OC43 NOT DETECTED NOT DETECTED Final   Metapneumovirus NOT DETECTED NOT DETECTED Final   Rhinovirus / Enterovirus NOT DETECTED NOT DETECTED Final   Influenza A NOT DETECTED NOT DETECTED Final   Influenza B NOT DETECTED NOT DETECTED Final   Parainfluenza Virus 1 NOT DETECTED NOT DETECTED Final   Parainfluenza Virus 2 NOT DETECTED NOT DETECTED Final   Parainfluenza Virus 3 NOT DETECTED NOT DETECTED Final   Parainfluenza Virus 4 NOT DETECTED NOT DETECTED Final   Respiratory Syncytial Virus NOT DETECTED NOT DETECTED Final   Bordetella pertussis NOT DETECTED NOT DETECTED Final   Chlamydophila pneumoniae NOT DETECTED NOT DETECTED Final    Mycoplasma pneumoniae NOT DETECTED NOT DETECTED Final    Comment: Performed at [facility], [place]. [address], [place], [place] [address]  Culture, blood (routine x 2)     Status: None   Collection Time: 05/24/19 10:43 PM  Result Value Ref Range Status   Specimen Description   Final    BLOOD ELINE Performed at [place] [address], [place], [place] [address]    Special Requests  Final    BOTTLES DRAWN AEROBIC AND ANAEROBIC Blood Culture adequate volume Performed at [facility] [address], [place], [place] [address]    Culture   Final    NO GROWTH 5 DAYS Performed at [facility], [place] [address], [place], [place] [address]    Report Status 05/30/2019 FINAL  Final  Culture, blood (routine x 2)     Status: None   Collection Time: 05/24/19 10:43 PM  Result Value Ref Range Status   Specimen Description   Final    BLOOD CENTRAL LINE Performed at [facility], [place] [address], [place], [place] [address]    Special Requests   Final    BOTTLES DRAWN AEROBIC AND ANAEROBIC Blood Culture results may not be optimal due to an excessive volume of blood received in culture bottles Performed at [place] [address] [place], [place] [address]    Culture   Final    NO GROWTH 5 DAYS Performed at [facility], [place] [address] [place], [place] [address]    Report Status 05/30/2019 FINAL  Final  Culture, respiratory (non-expectorated)     Status: None (Preliminary result)   Collection Time: 05/26/19  9:33 AM  Result Value Ref Range Status   Specimen Description   Final    TRACHEAL ASPIRATE Performed at [facility] [address] [place], [place] [address]    Special Requests   Final    NONE Performed at [facility], [place] [address], [place], [place] [address]    Gram Stain   Final    ABUNDANT WBC PRESENT, PREDOMINANTLY PMN FEW GRAM  POSITIVE RODS    Culture   Final    FEW PSEUDOMONAS AERUGINOSA Sent to [place] for further susceptibility testing. Performed at [facility], [place] [address] [place], [place] [address]    Report Status PENDING  Incomplete   Organism ID, Bacteria PSEUDOMONAS AERUGINOSA  Final      Susceptibility   Pseudomonas aeruginosa - MIC*    CEFTAZIDIME 16 INTERMEDIATE Intermediate     CIPROFLOXACIN 2 INTERMEDIATE Intermediate     GENTAMICIN 2 SENSITIVE Sensitive     IMIPENEM >=16 RESISTANT Resistant     CEFEPIME 16 INTERMEDIATE Intermediate     * FEW PSEUDOMONAS AERUGINOSA    Michel Bickers, MD [facility] [place] [facility] [phone] pager   [phone] cell 05/31/2019, 4:21 PM

## 2019-05-31 NOTE — Progress Notes (Signed)
Pt placed on home vent (trilogy 100) per md rx.  Settings, simv/ps mode, vt 480, rr16, PS10, peep5, 4L o2 bleedin.  Pt tolerating well at this time. Servo-I vent remains in room at bedside.   RT will continue to monitor and assess pt.

## 2019-05-31 NOTE — Progress Notes (Signed)
PHARMACY CONSULT NOTE FOR:  OUTPATIENT  PARENTERAL ANTIBIOTIC THERAPY (OPAT)  Indication: Multi-Drug Resistant Pseudomonas PNA Regimen: Zerbaxa 3gm IV q8h End date: 06/05/19  IV antibiotic discharge orders are pended. To discharging provider:  please sign these orders via discharge navigator,  Select New Orders & click on the button choice - Manage This Unsigned Work.     Thank you for allowing pharmacy to be a part of this patient's care.  Elson Clan 05/31/2019, 5:04 PM

## 2019-05-31 NOTE — Progress Notes (Signed)
Pt eating breakfast wants to wait on cpt
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
## 2019-05-31 NOTE — Progress Notes (Signed)
Peripherally Inserted Central Catheter/Midline Placement  The IV Nurse has discussed with the patient and/or persons authorized to consent for the patient, the purpose of this procedure and the potential benefits and risks involved with this procedure.  The benefits include less needle sticks, lab draws from the catheter, and the patient may be discharged home with the catheter. Risks include, but not limited to, infection, bleeding, blood clot (thrombus formation), and puncture of an artery; nerve damage and irregular heartbeat and possibility to perform a PICC exchange if needed/ordered by physician.  Alternatives to this procedure were also discussed.  Bard Power PICC patient education guide, fact sheet on infection prevention and patient information card has been provided to patient /or left at bedside.    PICC/Midline Placement Documentation  PICC Single Lumen 05/31/19 PICC Right Brachial 38 cm 0 cm (Active)       Annett Fabian 05/31/2019, 12:45 PM

## 2019-05-31 NOTE — TOC Progression Note (Signed)
Transition of Care [facility]) - Progression Note    Patient Details  Name: Cody Vaughn MRN: [id number] Date of Birth: 1971-08-22  Transition of Care [facility]) CM/SW Contact  Aino Heckert, Meriam Sprague, RN Phone Number: 05/31/2019, 10:47 AM  Clinical Narrative:    Active with [facility] for home care with home vent. Jeri Modena with [facility] was contacted about home IV abx at discharge for pt. Pam to connect with [facility] for the home nursing services and will follow along to assist with home abx.    Sandford Craze RN,BSN [phone]

## 2019-06-01 LAB — CBC WITH DIFFERENTIAL/PLATELET
Abs Immature Granulocytes: 0.38 10*3/uL — ABNORMAL HIGH (ref 0.00–0.07)
Basophils Absolute: 0.1 10*3/uL (ref 0.0–0.1)
Basophils Relative: 1 %
Eosinophils Absolute: 0.4 10*3/uL (ref 0.0–0.5)
Eosinophils Relative: 4 %
HCT: 28.1 % — ABNORMAL LOW (ref 39.0–52.0)
Hemoglobin: 8.2 g/dL — ABNORMAL LOW (ref 13.0–17.0)
Immature Granulocytes: 3 %
Lymphocytes Relative: 20 %
Lymphs Abs: 2.3 10*3/uL (ref 0.7–4.0)
MCH: 27.2 pg (ref 26.0–34.0)
MCHC: 29.2 g/dL — ABNORMAL LOW (ref 30.0–36.0)
MCV: 93.4 fL (ref 80.0–100.0)
Monocytes Absolute: 1 10*3/uL (ref 0.1–1.0)
Monocytes Relative: 8 %
Neutro Abs: 7.7 10*3/uL (ref 1.7–7.7)
Neutrophils Relative %: 64 %
Platelets: 387 10*3/uL (ref 150–400)
RBC: 3.01 MIL/uL — ABNORMAL LOW (ref 4.22–5.81)
RDW: 16.3 % — ABNORMAL HIGH (ref 11.5–15.5)
WBC: 11.9 10*3/uL — ABNORMAL HIGH (ref 4.0–10.5)
nRBC: 0 % (ref 0.0–0.2)

## 2019-06-01 LAB — BLOOD GAS, ARTERIAL
Acid-Base Excess: 4.6 mmol/L — ABNORMAL HIGH (ref 0.0–2.0)
Bicarbonate: 30 mmol/L — ABNORMAL HIGH (ref 20.0–28.0)
Drawn by: 560031
MECHVT: 480 mL
O2 Content: 4 L/min
O2 Saturation: 95 %
PEEP: 5 cmH2O
Patient temperature: 37
Pressure support: 10 cmH2O
RATE: 16 resp/min
pCO2 arterial: 51.5 mmHg — ABNORMAL HIGH (ref 32.0–48.0)
pH, Arterial: 7.383 (ref 7.350–7.450)
pO2, Arterial: 81.9 mmHg — ABNORMAL LOW (ref 83.0–108.0)

## 2019-06-01 LAB — BASIC METABOLIC PANEL
Anion gap: 7 (ref 5–15)
BUN: 19 mg/dL (ref 6–20)
CO2: 26 mmol/L (ref 22–32)
Calcium: 9.1 mg/dL (ref 8.9–10.3)
Chloride: 102 mmol/L (ref 98–111)
Creatinine, Ser: 0.33 mg/dL — ABNORMAL LOW (ref 0.61–1.24)
GFR calc Af Amer: >60 mL/min (ref >60)
GFR calc non Af Amer: >60 mL/min (ref >60)
Glucose, Bld: 102 mg/dL — ABNORMAL HIGH (ref 70–99)
Potassium: 4.2 mmol/L (ref 3.5–5.1)
Sodium: 135 mmol/L (ref 135–145)

## 2019-06-01 MED ORDER — CEFTOLOZANE-TAZOBACTAM IV (FOR PTA / DISCHARGE USE): 3.0000 g | Freq: Three times a day (TID) | INTRAVENOUS | 0 refills | Status: AC

## 2019-06-01 MED ORDER — OXYCODONE-ACETAMINOPHEN 5-325 MG PO TABS: 1.0000 | ORAL_TABLET | Freq: Three times a day (TID) | ORAL | 0 refills | Status: DC | PRN

## 2019-06-01 MED ORDER — OXYCODONE-ACETAMINOPHEN 5-325 MG PO TABS
1.0000 | ORAL_TABLET | ORAL | Status: DC | PRN
  Administered 2019-06-01 (×3): 1 via ORAL
  Filled 2019-06-01 (×3): qty 1

## 2019-06-01 MED ORDER — JUVEN PO PACK: 1.0000 | PACK | Freq: Two times a day (BID) | ORAL | 0 refills | Status: AC

## 2019-06-01 MED ORDER — ENSURE ENLIVE PO LIQD: 237.0000 mL | ORAL | 0 refills | Status: AC

## 2019-06-01 MED ORDER — PRO-STAT SUGAR FREE PO LIQD: 30.0000 mL | Freq: Every day | ORAL | 0 refills | Status: AC

## 2019-06-01 MED ORDER — CHLORHEXIDINE GLUCONATE CLOTH 2 % EX PADS
6.0000 | MEDICATED_PAD | Freq: Every day | CUTANEOUS | Status: DC
  Administered 2019-06-01: 11:00:00 6 via TOPICAL

## 2019-06-01 NOTE — Progress Notes (Signed)
Occupational Therapy Treatment Patient Details Name: Cody BoschChristopher E Vaughn MRN: [id number] DOB: 09/13/1971 Today's Date: 06/01/2019    History of present illness 48 yo quadriplegic M w/ PMHx sig for MDR Pseudomonas infection, OSA, chronic Hep C w/ cirrhosis and seizures. Pt presents from [facility] per the EDP documentation the pt complained of  fever and worsening SOB that began on the evening of 5/26.  Pt is vent dept w/ frequent mucous plugging at his baseline.  COVID test negative. [facility]   OT comments  Co tx with SLP while pt on passey muir. Pt much more awake.  He reports significant loss of function since a month ago, but he has been quadriplegic since last July.  He has limited ROM, but may be able to use R wrist support with universal cuff and proximal assist at elbow as well as assist to scoop food to participate in self feeding at max A level. Recommend continued OT, if another service is involved in his home to maximize participation.     Follow Up Recommendations  Home health OT;Supervision/Assistance - 24 hour    Equipment Recommendations  None recommended by OT    Recommendations for Other Services      Precautions / Restrictions Precautions Precaution Comments: pt is quad Restrictions Weight Bearing Restrictions: No       Mobility Bed Mobility                  Transfers                      Balance                                           ADL either performed or assessed with clinical judgement   ADL                                         General ADL Comments: Pt states that he has been quadriplegic since last July. He reports that last month , he had a decrease in function and that he was able to self feed with regular utensils.  He also reports that he could do a lot more.  Pt without AROM of wrist/fingers.  Wrist is fully flexed; hand flexed like tenodesis.  Issued a R wrist support with  universal cuff slot.  Pt cannot pronate, but with proximal support, he would be able to assist with feeding at max A level for spoon fed food; total A for beverages.  Pt plans home today     Vision       Perception     Praxis      Cognition Arousal/Alertness: Awake/alert Behavior During Therapy: WFL for tasks assessed/performed                                   General Comments: unable to verify answers pt provided.  Shelbie HutchingPassey Muir on during session        Exercises     Shoulder Instructions       General Comments on vent and passey muir valve.  Increased WOB     Pertinent Vitals/ Pain       Pain  Assessment: Faces Faces Pain Scale: Hurts even more Pain Location: bil shoulders Pain Descriptors / Indicators: Aching Pain Intervention(s): Limited activity within patient's tolerance;Monitored during session;Repositioned  Home Living                                          Prior Functioning/Environment              Frequency  Min 2X/week        Progress Toward Goals  OT Goals(current goals can now be found in the care plan section)  Progress towards OT goals: Progressing toward goals     Plan      Co-evaluation    PT/OT/SLP Co-Evaluation/Treatment: Yes Reason for Co-Treatment: (maximize services while passey muir on)          AM-PAC OT "6 Clicks" Daily Activity     Outcome Measure   Help from another person eating meals?: Total Help from another person taking care of personal grooming?: Total Help from another person toileting, which includes using toliet, bedpan, or urinal?: Total Help from another person bathing (including washing, rinsing, drying)?: Total Help from another person to put on and taking off regular upper body clothing?: Total Help from another person to put on and taking off regular lower body clothing?: Total 6 Click Score: 6    End of Session    OT Visit Diagnosis: Muscle weakness  (generalized) (M62.81)   Activity Tolerance Other (comment)   Patient Left in bed   Nurse Communication          Time: 6767-2094 OT Time Calculation (min): 25 min  Charges: OT General Charges $OT Visit: 1 Visit OT Treatments $Self Care/Home Management : 8-22 mins  Marica Otter, OTR/L Acute Rehabilitation Services [phone] [facility] pager [phone] office 06/01/2019   Cody Vaughn 06/01/2019, 1:02 PM

## 2019-06-01 NOTE — Progress Notes (Signed)
Helped pt and speech therapy with pmv on pt pts home vent. Suctioned pt prior to cuff deflation. Small to moderate white secretions. Cuff was deflated. PMV placed.Pt able to speak clearly. Had some coughing and winded periods. Overall valve was tolerated well. Removed pmv after pt spoke with OT RN and Speech. Cuff reinflated once pmv removed.

## 2019-06-01 NOTE — TOC Transition Note (Signed)
Transition of Care [facility]) - CM/SW Discharge Note   Patient Details  Name: Cody Vaughn MRN: [id number] Date of Birth: [birth year]  Transition of Care [facility] CM/SW Contact:  Nelwyn Salisbury, LCSW Phone Number: [phone] 06/01/2019, 2:06 PM   Clinical Narrative:   Pt discharging home today, [facility] providing home IV antibiotics and [facility] providing nursing. Spoke with both and with pt's daughter. Will arrange transportation (with home ventilator)  Discharge Placement               Home- Home Health          Readmission Risk Interventions No flowsheet data found.

## 2019-06-01 NOTE — Progress Notes (Signed)
Pt states he his getting ready to go home and will do his nebulized breathing treatment and CPT there.

## 2019-06-01 NOTE — Progress Notes (Signed)
Patient ID: KAHLEIL PENA, male   DOB: July 12, 1971, 48 y.o.   MRN: [id number]         [facility] for Infectious Disease    Date of Admission:  05/24/2019   Day 7 ceftolozane tazobactam         Mr. Campau remains afebrile and seems to be improving on therapy for a multidrug-resistant Pseudomonas pneumonia.  Arrangements have been made to continue his current IV antibiotic therapy at home through 06/05/2019.  I will sign off now.         Cliffton Asters, MD [facility] for Infectious Disease [facility] [phone] pager   [phone] cell 06/01/2019, 10:57 AM

## 2019-06-01 NOTE — Discharge Summary (Signed)
Physician Discharge Summary  Cody Vaughn RFF:[id number] DOB: March 27, 1971 DOA: 05/24/2019  PCP: Patient, No Pcp Per  Admit date: 05/24/2019 Discharge date: 06/01/2019  Admitted From: Home  Disposition:  Home   Recommendations for Outpatient Follow-up and new medication changes:  1. Follow up with Primary Care in 7 days.  2. Patient will continue antibiotic therapy with Ceftolazane IV through 06/05/19.  3. Resume home health services 4. Resume home ventilator.  5. Patient has been placed on nutritional supplements. 6. Continue local wound care per home health services.  7. Please remove PICC line after completing antibiotic therapy on 06/05/19  Home Health: Yes   Equipment/Devices: Home invasive ventilator.  Settings: (trilogy 100). SIMV/ps mode, vt 480, rr16, PS10, peep5, 4L o2   Discharge Condition: stabe CODE STATUS: full  Diet recommendation: Regular.   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  Patient has chronic ventilator dependent respiratory failure.  On his initial physical examination he was hemodynamically stable, temperature 37.4 C, he was connected to the ventilator and required an FiO2 60%, PEEP of 8.  His lungs had coarse breath sounds, wheezing and rhonchi, heart S1-S2 present and rhythmic, abdomen soft nontender, no lower extremity edema.  His extremities were contracted.  Sodium 141, potassium 3.0, chloride 110, bicarb 23, glucose 126, BUN 12, creatinine 0.3, white count 24.8, hemoglobin 7.9, hematocrit 26.0, platelets 464.   Urinalysis 21-50 white cells, 11-20 red cells.  SARS COVID-19 negative ([facility]), his initial chest radiograph had hyperinflation with no infiltrates, subsequent radiographs showed right lower lobe infiltrate confirmed by CT chest.  EKG was 144 bpm, normal axis, normal  intervals, sinus rhythm with no ST segment or T wave changes.  Patient was admitted to the hospital with acute on chronic hypoxic respiratory failure complicated by septic shock due to right lower lobe pneumonia.   1.  Acute on chronic hypoxic respiratory failure due to right lower lobe pneumonia, complicated by sepsis. (Present on admission).  Patient was admitted to the intensive care unit, he was placed on broad-spectrum antibiotic therapy, intravenous fluids, and he was continued on invasive mechanical ventilation.  A tracheal aspirate culture was positive for Pseudomonas which was multidrug resistant.  Infectious disease was consulted and patient has been placed on Ceftolazane intravenously with good toleration.  Patient oxygen requirements decreased, his discharge white cell count is11.9, he has remained afebrile, and his blood cultures have remained no growth.  Further work-up with echocardiography showed preserved LV systolic function.  2.  Chronic anemia.  Patient did not required transfusion, his hemoglobin remained stable, at discharge is 8.2 with a hematocrit of 28.1.   3.  Stage IV sacral decubitus ulcer.  Patient was seen by wound care.  Patient has been on a wound VAC at home.  Patient will continue home wound care.  4.  Quadriparesis.  Acute on chronic right shoulder pain.  Patient received therapy while in bed, added Percocet for pain control with good toleration.  5.  Moderate protein calorie malnutrition.  Continue nutritional supplements.  Discharge Diagnoses:  Principal Problem:   HCAP (healthcare-associated pneumonia) Active Problems:   Acute on chronic respiratory failure with hypoxia ([place])   Septic shock ([place])   Encounter for central line placement   Suprapubic catheter ([place])   Ventilator dependence ([place])   Sacral wound   Pressure injury of skin  Discharge Instructions  Discharge Instructions    Home infusion instructions [facility] May follow [place] Dosing Protocol; May administer Cathflo as needed to maintain patency of vascular access device.; Flushing of vascular access device: per [facility] Protocol: 0.9% NaCl pre/post medica...   Complete by:  As directed    Instructions:  May follow [place] Dosing Protocol   Instructions:  May administer Cathflo as needed to maintain patency of vascular access device.   Instructions:  Flushing of vascular access device: per [facility] Protocol: 0.9% NaCl pre/post medication administration and prn patency; Heparin 100 u/ml, 26m for implanted ports and Heparin 10u/ml, 544mfor all other central venous catheters.   Instructions:  May follow [facility] Anaphylaxis Protocol for First Dose Administration in the home: 0.9% NaCl at 25-50 ml/hr to maintain IV access for protocol meds. Epinephrine 0.3 ml IV/IM PRN and Benadryl 25-50 IV/IM PRN s/s of anaphylaxis.   Instructions:  [facility] Coordinator (RN) to assist per patient IV care needs in the home PRN.     Allergies as of 06/01/2019      Reactions   Penicillins Other (See Comments)   Daughter isn't sure what kind of reaction her father had with penicillin      Medication List    TAKE these medications   ceftolozane-tazobactam  IVPB Commonly known as:  ZERBAXA Inject 3 g into the vein every 8 (eight) hours for 5 days. Indication:  MDR Pseudomonas PNA Last Day of Therapy:  06/05/2019 Labs - Once weekly:  CBC/D and BMP, Labs - Every other week:  ESR and CRP   celecoxib 100 MG capsule Commonly known as:  CELEBREX Take 100 mg by mouth 2 (two) times daily.   famotidine 40 MG tablet Commonly known as:  PEPCID Take 40 mg by mouth at bedtime as needed for heartburn or indigestion.   feeding supplement (PRO-STAT SUGAR FREE 64) Liqd Take 30 mLs by mouth daily for 30 days.   folic acid 1 MG tablet Commonly known as:  FOLVITE Take 1 mg by mouth daily.   ipratropium-albuterol 0.5-2.5 (3) MG/3ML Soln Commonly known as:  DUONEB Take 3 mLs by  nebulization every 4 (four) hours as needed (wheezing, shortness of breath).   Lacosamide 100 MG Tabs Take 100 mg by mouth 2 (two) times daily.   methocarbamol 500 MG tablet Commonly known as:  ROBAXIN Take 1,000 mg by mouth 3 (three) times daily.   midodrine 10 MG tablet Commonly known as:  PROAMATINE Take 10 mg by mouth every 8 (eight) hours.   multivitamin tablet Take 1 tablet by mouth daily.   Neurontin 300 MG capsule Generic drug:  gabapentin Take 900 mg by mouth 3 (three) times daily.   nutrition supplement (JUVEN) Pack Take 1 packet by mouth 2 (two) times daily between meals for 30 days.   feeding supplement (ENSURE ENLIVE) Liqd Take 237 mLs by mouth daily for 30 days.   oxyCODONE-acetaminophen 5-325 MG tablet Commonly known as:  PERCOCET/ROXICET Take 1 tablet by mouth every 8 (eight) hours as needed for severe pain (shoulder pain).   rivaroxaban 20 MG Tabs tablet Commonly known as:  XARELTO Take 20 mg by mouth daily with supper.   simethicone 125 MG chewable tablet Commonly known as:  MYLICON Chew 12832g by mouth every 6 (six) hours as needed for flatulence.   thiamine 100 MG tablet Commonly known as:  VITAMIN B-1 Take 100 mg by mouth daily.   venlafaxine XR 75 MG  24 hr capsule Commonly known as:  EFFEXOR-XR Take 75 mg by mouth 2 (two) times a day.            Home Infusion Instuctions  (From admission, onward)         Start     Ordered   06/01/19 0000  Home infusion instructions [facility] May follow [place] Dosing Protocol; May administer Cathflo as needed to maintain patency of vascular access device.; Flushing of vascular access device: per [facility] Protocol: 0.9% NaCl pre/post medica...    Question Answer Comment  Instructions May follow [place] Dosing Protocol   Instructions May administer Cathflo as needed to maintain patency of vascular access device.   Instructions Flushing of vascular access device: per [facility] Protocol: 0.9% NaCl  pre/post medication administration and prn patency; Heparin 100 u/ml, 10m for implanted ports and Heparin 10u/ml, 567mfor all other central venous catheters.   Instructions May follow [facility] Anaphylaxis Protocol for First Dose Administration in the home: 0.9% NaCl at 25-50 ml/hr to maintain IV access for protocol meds. Epinephrine 0.3 ml IV/IM PRN and Benadryl 25-50 IV/IM PRN s/s of anaphylaxis.   Instructions [facility] Infusion Coordinator (RN) to assist per patient IV care needs in the home PRN.      06/01/19 11[place](From admission, onward)         Start     Ordered   06/01/19 1119  For home use only DME Ventilator  Once    Comments:  Resume prior home vent with settings as follows:  SIMV - Vt 480, R16, PEEP 5, PS 10, 4L O2 bled in and reduce I-time to 0.7.    VENT ALARMS > need to be turned on / adjusted.  Alarm for rate needs to be reduced to 10 (below his set rate of 16).  This is likely why it has been alarming.  Question:  Length of Need  Answer:  Lifetime   06/01/19 1120          Allergies  Allergen Reactions  . Penicillins Other (See Comments)    Daughter isn't sure what kind of reaction her father had with penicillin    Consultations:  ID  Wound care   Procedures/Studies: Ct Chest W Contrast  Result Date: 05/25/2019 CLINICAL DATA:  Inpatient. Fever of unknown origin. Quadriplegia. Hypoxia. Stage IV decubitus ulcer. Chronic HCV. EXAM: CT CHEST, ABDOMEN, AND PELVIS WITH CONTRAST TECHNIQUE: Multidetector CT imaging of the chest, abdomen and pelvis was performed following the standard protocol during bolus administration of intravenous contrast. CONTRAST:  10024mMNIPAQUE IOHEXOL 300 MG/ML  SOLN COMPARISON:  02/23/2019 chest CT angiogram. 04/24/2017 CT abdomen/pelvis. FINDINGS: CT CHEST FINDINGS Cardiovascular: Normal heart size. No significant pericardial effusion/thickening. Atherosclerotic nonaneurysmal thoracic aorta.  Normal caliber pulmonary arteries. No convincing central pulmonary emboli. Mediastinum/Nodes: No discrete thyroid nodules. Unremarkable esophagus. No axillary adenopathy. Newly mildly enlarged 1.0 cm subcarinal node (series 2/image 32). No additional pathologically enlarged mediastinal nodes. Enlarged 1.6 cm right hilar node (series 2/image 29), previously 1.6 cm using similar measurement technique, stable. No left hilar adenopathy. Lungs/Pleura: Tracheostomy tube terminates in the tracheal air column just below the thoracic inlet. The central right upper lobe, right middle lobe and entire right lower lobe airways are occluded. There is patchy tree-in-bud opacity in the dependent and basilar right upper lobe and dependent right middle lobe. Complete dense consolidation of the right lower lobe with  some associated volume loss. Minimal patchy consolidation is noted in the dependent basilar left lower lobe. No discrete lung masses or significant pulmonary nodules. No pleural effusion. Musculoskeletal: No aggressive appearing focal osseous lesions. Minimal thoracic spondylosis. Advanced right glenohumeral osteoarthritis. CT ABDOMEN PELVIS FINDINGS Hepatobiliary: Normal liver with no liver mass. Normal gallbladder with no radiopaque cholelithiasis. No biliary ductal dilatation. Pancreas: Normal, with no mass or duct dilation. Spleen: Normal size. No mass. Adrenals/Urinary Tract: Normal adrenals. Normal kidneys with no hydronephrosis and no renal mass. Bladder decompressed by indwelling suprapubic catheter and not well evaluated. Stomach/Bowel: Percutaneous gastrostomy tube is in place in the anterior proximal stomach. No acute gastric abnormality. Normal caliber small bowel. There are 2 short jejunal intussusceptions in the left abdomen (series 2/images 91 and 100). No definite small bowel wall thickening. Oral contrast transits to the distal colon. Appendix not discretely visualized. No pericecal inflammatory changes.  Status post subtotal distal colectomy with end sigmoid colostomy in the ventral left abdominal wall with unremarkable Hartmann's pouch. No definite large bowel wall thickening or significant pericolonic fat stranding. Vascular/Lymphatic: Minimally atherosclerotic nonaneurysmal abdominal aorta. Patent portal, splenic, hepatic and renal veins. No pathologically enlarged lymph nodes in the abdomen or pelvis. Reproductive: Top-normal size prostate. Other: No pneumoperitoneum, ascites or focal fluid collection. Musculoskeletal: No aggressive appearing focal osseous lesions. Advanced degenerative disc disease and bilateral facet arthropathy in the lower lumbar spine. Large lower sacral decubitus ulcer noted extending to the bone with associated sclerosis and bone loss at the sacrococcygeal junction compatible with surgical debridement and/or chronic osteomyelitis. IMPRESSION: 1. Complete dense right lower lobe consolidation. Minimal patchy dependent left lung base consolidation. Patchy tree-in-bud opacities in the right upper and right middle lobes. Central right airways are occluded, presumably by mucoid impaction. Findings are most compatible with multilobar pneumonia predominantly involving the right lower lobe. 2. New mild subcarinal lymphadenopathy, nonspecific, statistically most likely to be reactive. Stable chronic mild right hilar adenopathy, most compatible with reactive adenopathy. 3. Well-positioned tracheostomy tube, gastrostomy tube and suprapubic bladder catheter. 4. No acute bowel abnormality. No abdominal abscess. Expected postsurgical changes from end sigmoid colostomy. 5. Large lower sacral decubitus ulcer with sclerosis and bone loss at the sacrococcygeal junction compatible with surgical debridement and/or chronic osteomyelitis. 6.  Aortic Atherosclerosis (ICD10-I70.0). Electronically Signed   By: Ilona Sorrel M.D.   On: 05/25/2019 17:52   Ct Abdomen Pelvis W Contrast  Result Date:  05/25/2019 CLINICAL DATA:  Inpatient. Fever of unknown origin. Quadriplegia. Hypoxia. Stage IV decubitus ulcer. Chronic HCV. EXAM: CT CHEST, ABDOMEN, AND PELVIS WITH CONTRAST TECHNIQUE: Multidetector CT imaging of the chest, abdomen and pelvis was performed following the standard protocol during bolus administration of intravenous contrast. CONTRAST:  142m OMNIPAQUE IOHEXOL 300 MG/ML  SOLN COMPARISON:  02/23/2019 chest CT angiogram. 04/24/2017 CT abdomen/pelvis. FINDINGS: CT CHEST FINDINGS Cardiovascular: Normal heart size. No significant pericardial effusion/thickening. Atherosclerotic nonaneurysmal thoracic aorta. Normal caliber pulmonary arteries. No convincing central pulmonary emboli. Mediastinum/Nodes: No discrete thyroid nodules. Unremarkable esophagus. No axillary adenopathy. Newly mildly enlarged 1.0 cm subcarinal node (series 2/image 32). No additional pathologically enlarged mediastinal nodes. Enlarged 1.6 cm right hilar node (series 2/image 29), previously 1.6 cm using similar measurement technique, stable. No left hilar adenopathy. Lungs/Pleura: Tracheostomy tube terminates in the tracheal air column just below the thoracic inlet. The central right upper lobe, right middle lobe and entire right lower lobe airways are occluded. There is patchy tree-in-bud opacity in the dependent and basilar right upper lobe and dependent right middle lobe.  Complete dense consolidation of the right lower lobe with some associated volume loss. Minimal patchy consolidation is noted in the dependent basilar left lower lobe. No discrete lung masses or significant pulmonary nodules. No pleural effusion. Musculoskeletal: No aggressive appearing focal osseous lesions. Minimal thoracic spondylosis. Advanced right glenohumeral osteoarthritis. CT ABDOMEN PELVIS FINDINGS Hepatobiliary: Normal liver with no liver mass. Normal gallbladder with no radiopaque cholelithiasis. No biliary ductal dilatation. Pancreas: Normal, with no mass  or duct dilation. Spleen: Normal size. No mass. Adrenals/Urinary Tract: Normal adrenals. Normal kidneys with no hydronephrosis and no renal mass. Bladder decompressed by indwelling suprapubic catheter and not well evaluated. Stomach/Bowel: Percutaneous gastrostomy tube is in place in the anterior proximal stomach. No acute gastric abnormality. Normal caliber small bowel. There are 2 short jejunal intussusceptions in the left abdomen (series 2/images 91 and 100). No definite small bowel wall thickening. Oral contrast transits to the distal colon. Appendix not discretely visualized. No pericecal inflammatory changes. Status post subtotal distal colectomy with end sigmoid colostomy in the ventral left abdominal wall with unremarkable Hartmann's pouch. No definite large bowel wall thickening or significant pericolonic fat stranding. Vascular/Lymphatic: Minimally atherosclerotic nonaneurysmal abdominal aorta. Patent portal, splenic, hepatic and renal veins. No pathologically enlarged lymph nodes in the abdomen or pelvis. Reproductive: Top-normal size prostate. Other: No pneumoperitoneum, ascites or focal fluid collection. Musculoskeletal: No aggressive appearing focal osseous lesions. Advanced degenerative disc disease and bilateral facet arthropathy in the lower lumbar spine. Large lower sacral decubitus ulcer noted extending to the bone with associated sclerosis and bone loss at the sacrococcygeal junction compatible with surgical debridement and/or chronic osteomyelitis. IMPRESSION: 1. Complete dense right lower lobe consolidation. Minimal patchy dependent left lung base consolidation. Patchy tree-in-bud opacities in the right upper and right middle lobes. Central right airways are occluded, presumably by mucoid impaction. Findings are most compatible with multilobar pneumonia predominantly involving the right lower lobe. 2. New mild subcarinal lymphadenopathy, nonspecific, statistically most likely to be reactive.  Stable chronic mild right hilar adenopathy, most compatible with reactive adenopathy. 3. Well-positioned tracheostomy tube, gastrostomy tube and suprapubic bladder catheter. 4. No acute bowel abnormality. No abdominal abscess. Expected postsurgical changes from end sigmoid colostomy. 5. Large lower sacral decubitus ulcer with sclerosis and bone loss at the sacrococcygeal junction compatible with surgical debridement and/or chronic osteomyelitis. 6.  Aortic Atherosclerosis (ICD10-I70.0). Electronically Signed   By: Ilona Sorrel M.D.   On: 05/25/2019 17:52   Dg Chest Port 1 View  Result Date: 05/29/2019 CLINICAL DATA:  Respiratory failure EXAM: PORTABLE CHEST 1 VIEW COMPARISON:  05/27/2019 FINDINGS: Cardiac shadow is stable. Tracheostomy tube and right-sided jugular central line are again seen and stable. Multiple skin folds are noted over the right chest. Right basilar atelectasis is again identified and stable. No new focal bony abnormality is noted. IMPRESSION: Stable right basilar atelectasis. Electronically Signed   By: Inez Catalina M.D.   On: 05/29/2019 07:24   Dg Chest Port 1 View  Result Date: 05/27/2019 CLINICAL DATA:  Pneumonia EXAM: PORTABLE CHEST 1 VIEW COMPARISON:  CT chest dated 05/17/2019 FINDINGS: Right lower lobe atelectasis/collapse, better evaluated on CT. Left lung is clear. No pleural effusion or pneumothorax. Tracheostomy in satisfactory position. Right IJ venous catheter terminates in the mid SVC. The heart is normal in size. IMPRESSION: Right lower lobe atelectasis/collapse, better evaluated on CT. Electronically Signed   By: Julian Hy M.D.   On: 05/27/2019 06:44   Dg Chest Port 1 View  Result Date: 05/25/2019 CLINICAL DATA:  Check central  line placement EXAM: PORTABLE CHEST 1 VIEW COMPARISON:  05/24/2019 FINDINGS: Cardiac shadows within normal limits. Tracheostomy tube is noted. Right jugular central line is noted in the proximal superior vena cava. Increasing density is  noted in the right lung base consistent with evolving atelectasis/early infiltrate. Left lung is clear. No pneumothorax is noted. IMPRESSION: Right jugular central line without pneumothorax. Increased right basilar density consistent with evolving atelectasis/infiltrate. Electronically Signed   By: Inez Catalina M.D.   On: 05/25/2019 02:38   Dg Chest Port 1 View  Result Date: 05/24/2019 CLINICAL DATA:  Pneumonia EXAM: PORTABLE CHEST 1 VIEW COMPARISON:  05/24/2019 FINDINGS: Cardiac shadow is stable. Patient is somewhat rotated. Tracheostomy tube is seen. Lungs are hyperinflated bilaterally. No pneumothorax or sizable effusion is seen. Previously seen left basilar changes have resolved. No bony abnormality is seen. IMPRESSION: No acute abnormality noted. Mild COPD. Electronically Signed   By: Inez Catalina M.D.   On: 05/24/2019 22:46   [place] Ekg Site Rite  Result Date: 05/31/2019 If Site Rite image not attached, placement could not be confirmed due to current cardiac rhythm.     Procedures: right CVA and PICC line   Subjective: Patient is feeling better, positive right shoulder pain, no nausea or vomiting, no dyspnea or chest pain, continue tolerate po well.   Discharge Exam: Vitals:   06/01/19 0900 06/01/19 1111  BP: (!) 152/89   Pulse: 89   Resp: 20   Temp:    SpO2: 96% 97%   Vitals:   06/01/19 0732 06/01/19 0800 06/01/19 0900 06/01/19 1111  BP:  128/78 (!) 152/89   Pulse:  76 89   Resp:  18 20   Temp:  98.4 F (36.9 C)    TempSrc:  Oral    SpO2: 97% 98% 96% 97%  Weight:      Height:        General: Not in pain or dyspnea Neurology: Awake and alert, non focal  E ENT: no pallor, no icterus, oral mucosa moist/ trach in place.  Cardiovascular: No JVD. S1-S2 present, rhythmic, no gallops, rubs, or murmurs. No lower extremity edema. Pulmonary: vesicular breath sounds bilaterally, adequate air movement, no wheezing, rhonchi or rales. Gastrointestinal. Abdomen flat, no organomegaly,  non tender, no rebound or guarding. Suprapubic catheter in place. Skin. No rashes Musculoskeletal: no joint deformities   The results of significant diagnostics from this hospitalization (including imaging, microbiology, ancillary and laboratory) are listed below for reference.     Microbiology: Recent Results (from the past 240 hour(s))  MRSA PCR Screening     Status: None   Collection Time: 05/24/19  8:59 PM  Result Value Ref Range Status   MRSA by PCR NEGATIVE NEGATIVE Final    Comment:        The GeneXpert MRSA Assay (FDA approved for NASAL specimens only), is one component of a comprehensive MRSA colonization surveillance program. It is not intended to diagnose MRSA infection nor to guide or monitor treatment for MRSA infections. Performed at [facility], [place] [address], [place], [place] [address]   Respiratory Panel by PCR     Status: None   Collection Time: 05/24/19 10:12 PM  Result Value Ref Range Status   Adenovirus NOT DETECTED NOT DETECTED Final   Coronavirus 229E NOT DETECTED NOT DETECTED Final    Comment: (NOTE) The Coronavirus on the Respiratory Panel, DOES NOT test for the novel  Coronavirus (2019 nCoV)    Coronavirus HKU1 NOT DETECTED NOT DETECTED Final  Coronavirus NL63 NOT DETECTED NOT DETECTED Final   Coronavirus OC43 NOT DETECTED NOT DETECTED Final   Metapneumovirus NOT DETECTED NOT DETECTED Final   Rhinovirus / Enterovirus NOT DETECTED NOT DETECTED Final   Influenza A NOT DETECTED NOT DETECTED Final   Influenza B NOT DETECTED NOT DETECTED Final   Parainfluenza Virus 1 NOT DETECTED NOT DETECTED Final   Parainfluenza Virus 2 NOT DETECTED NOT DETECTED Final   Parainfluenza Virus 3 NOT DETECTED NOT DETECTED Final   Parainfluenza Virus 4 NOT DETECTED NOT DETECTED Final   Respiratory Syncytial Virus NOT DETECTED NOT DETECTED Final   Bordetella pertussis NOT DETECTED NOT DETECTED Final   Chlamydophila pneumoniae NOT DETECTED NOT  DETECTED Final   Mycoplasma pneumoniae NOT DETECTED NOT DETECTED Final    Comment: Performed at [facility], [place] [address] [place], [place] [address]  Culture, blood (routine x 2)     Status: None   Collection Time: 05/24/19 10:43 PM  Result Value Ref Range Status   Specimen Description   Final    BLOOD ELINE Performed at [place] [address] [place], [place] [address]    Special Requests   Final    BOTTLES DRAWN AEROBIC AND ANAEROBIC Blood Culture adequate volume Performed at [place] [address] [place], [place] [address]    Culture   Final    NO GROWTH 5 DAYS Performed at [facility], [place] [address] [place], [place] [address]    Report Status 05/30/2019 FINAL  Final  Culture, blood (routine x 2)     Status: None   Collection Time: 05/24/19 10:43 PM  Result Value Ref Range Status   Specimen Description   Final    BLOOD CENTRAL LINE Performed at [facility], [place] [address], [place], [place] [address]    Special Requests   Final    BOTTLES DRAWN AEROBIC AND ANAEROBIC Blood Culture results may not be optimal due to an excessive volume of blood received in culture bottles Performed at [place] [address], [place], [place] [address]    Culture   Final    NO GROWTH 5 DAYS Performed at [facility], [place] [address], [place], [place] [address]    Report Status 05/30/2019 FINAL  Final  Culture, respiratory (non-expectorated)     Status: None (Preliminary result)   Collection Time: 05/26/19  9:33 AM  Result Value Ref Range Status   Specimen Description   Final    TRACHEAL ASPIRATE Performed at [place] [facility] [address], [place], [place] [address]    Special Requests   Final    NONE Performed at [facility], [place] [address] [place], [place] [address]    Gram Stain   Final    ABUNDANT WBC PRESENT, PREDOMINANTLY  PMN FEW GRAM POSITIVE RODS    Culture   Final    FEW PSEUDOMONAS AERUGINOSA Sent to [place] for further susceptibility testing. Performed at [facility], [place] [address] [place], [place] [address]    Report Status PENDING  Incomplete   Organism ID, Bacteria PSEUDOMONAS AERUGINOSA  Final      Susceptibility   Pseudomonas aeruginosa - MIC*    CEFTAZIDIME 16 INTERMEDIATE Intermediate     CIPROFLOXACIN 2 INTERMEDIATE Intermediate     GENTAMICIN 2 SENSITIVE Sensitive     IMIPENEM >=16 RESISTANT Resistant     CEFEPIME 16 INTERMEDIATE Intermediate     *  FEW PSEUDOMONAS AERUGINOSA     Labs: BNP (last 3 results) No results for input(s): BNP in the last 8760 hours. Basic Metabolic Panel: Recent Labs  Lab 05/26/19 0436 05/27/19 0538 05/29/19 0645 05/30/19 0517 05/31/19 0421 06/01/19 0504  NA 136 136 135 138 137 135  K 3.7 3.5 3.8 3.8 3.7 4.2  CL 101 104 104 103 105 102  CO2 _0 GLUCOSE 134* 96 111* 105* 111* 102*  BUN _1 CREATININE 0.44* 0.36* 0.35* 0.37* 0.30* 0.33*  CALCIUM 9.0 8.6* 8.7* 9.1 9.0 9.1  MG 2.3  --   --   --   --   --    Liver Function Tests: No results for input(s): AST, ALT, ALKPHOS, BILITOT, PROT, ALBUMIN in the last 168 hours. No results for input(s): LIPASE, AMYLASE in the last 168 hours. No results for input(s): AMMONIA in the last 168 hours. CBC: Recent Labs  Lab 05/27/19 0538 05/28/19 0718 05/29/19 0645 05/30/19 0517 05/31/19 0421 06/01/19 0504  WBC 14.0*  --  8.2 8.3 10.2 11.9*  NEUTROABS 11.7*  --   --   --   --  7.7  HGB 6.6* 7.7* 7.4* 8.4* 8.0* 8.2*  HCT 22.5* 25.4* 25.5* 28.0* 26.6* 28.1*  MCV 91.5  --  92.7 91.8 92.4 93.4  PLT 333  --  339 392 379 387   Cardiac Enzymes: No results for input(s): CKTOTAL, CKMB, CKMBINDEX, TROPONINI in the last 168 hours. BNP: Invalid input(s): POCBNP CBG: Recent Labs  Lab 05/28/19 1613 05/28/19 1933 05/29/19 0009 05/29/19 0400 05/29/19 0838  GLUCAP 114*  132* 104* 119* 106*   D-Dimer No results for input(s): DDIMER in the last 72 hours. Hgb A1c No results for input(s): HGBA1C in the last 72 hours. Lipid Profile No results for input(s): CHOL, HDL, LDLCALC, TRIG, CHOLHDL, LDLDIRECT in the last 72 hours. Thyroid function studies No results for input(s): TSH, T4TOTAL, T3FREE, THYROIDAB in the last 72 hours.  Invalid input(s): FREET3 Anemia work up No results for input(s): VITAMINB12, FOLATE, FERRITIN, TIBC, IRON, RETICCTPCT in the last 72 hours. Urinalysis    Component Value Date/Time   COLORURINE YELLOW 05/24/2019 2234   APPEARANCEUR HAZY (A) 05/24/2019 2234   LABSPEC 1.018 05/24/2019 2234   PHURINE 6.0 05/24/2019 2234   GLUCOSEU NEGATIVE 05/24/2019 2234   HGBUR MODERATE (A) 05/24/2019 2234   BILIRUBINUR NEGATIVE 05/24/2019 2234   [place] 05/24/2019 2234   PROTEINUR 30 (A) 05/24/2019 2234   NITRITE POSITIVE (A) 05/24/2019 2234   LEUKOCYTESUR SMALL (A) 05/24/2019 2234   Sepsis Labs Invalid input(s): PROCALCITONIN,  WBC,  LACTICIDVEN Microbiology Recent Results (from the past 240 hour(s))  MRSA PCR Screening     Status: None   Collection Time: 05/24/19  8:59 PM  Result Value Ref Range Status   MRSA by PCR NEGATIVE NEGATIVE Final    Comment:        The GeneXpert MRSA Assay (FDA approved for NASAL specimens only), is one component of a comprehensive MRSA colonization surveillance program. It is not intended to diagnose MRSA infection nor to guide or monitor treatment for MRSA infections. Performed at [facility], [place] [address], [place], [place] [address]   Respiratory Panel by PCR     Status: None   Collection Time: 05/24/19 10:12 PM  Result Value Ref Range Status   Adenovirus NOT DETECTED NOT DETECTED Final   Coronavirus 229E NOT DETECTED NOT DETECTED Final  Comment: (NOTE) The Coronavirus on the Respiratory Panel, DOES NOT test for the novel  Coronavirus (2019 nCoV)    Coronavirus  HKU1 NOT DETECTED NOT DETECTED Final   Coronavirus NL63 NOT DETECTED NOT DETECTED Final   Coronavirus OC43 NOT DETECTED NOT DETECTED Final   Metapneumovirus NOT DETECTED NOT DETECTED Final   Rhinovirus / Enterovirus NOT DETECTED NOT DETECTED Final   Influenza A NOT DETECTED NOT DETECTED Final   Influenza B NOT DETECTED NOT DETECTED Final   Parainfluenza Virus 1 NOT DETECTED NOT DETECTED Final   Parainfluenza Virus 2 NOT DETECTED NOT DETECTED Final   Parainfluenza Virus 3 NOT DETECTED NOT DETECTED Final   Parainfluenza Virus 4 NOT DETECTED NOT DETECTED Final   Respiratory Syncytial Virus NOT DETECTED NOT DETECTED Final   Bordetella pertussis NOT DETECTED NOT DETECTED Final   Chlamydophila pneumoniae NOT DETECTED NOT DETECTED Final   Mycoplasma pneumoniae NOT DETECTED NOT DETECTED Final    Comment: Performed at [facility], [place] [address] [place], [place] [address]  Culture, blood (routine x 2)     Status: None   Collection Time: 05/24/19 10:43 PM  Result Value Ref Range Status   Specimen Description   Final    BLOOD ELINE Performed at [place] [address] [place], [place] [address]    Special Requests   Final    BOTTLES DRAWN AEROBIC AND ANAEROBIC Blood Culture adequate volume Performed at [place] [address] [place], [place] [address]    Culture   Final    NO GROWTH 5 DAYS Performed at [facility], [place] [address], [place], [place] [address]    Report Status 05/30/2019 FINAL  Final  Culture, blood (routine x 2)     Status: None   Collection Time: 05/24/19 10:43 PM  Result Value Ref Range Status   Specimen Description   Final    BLOOD CENTRAL LINE Performed at [facility], [place] [address], [place], [place] [address]    Special Requests   Final    BOTTLES DRAWN AEROBIC AND ANAEROBIC Blood Culture results may not be optimal due to an excessive volume of blood received in culture  bottles Performed at [place] [address], [place], [place] [address]    Culture   Final    NO GROWTH 5 DAYS Performed at [facility], [place] [address] [place] [address] [place], [place] [address]    Report Status 05/30/2019 FINAL  Final  Culture, respiratory (non-expectorated)     Status: None (Preliminary result)   Collection Time: 05/26/19  9:33 AM  Result Value Ref Range Status   Specimen Description   Final    TRACHEAL ASPIRATE Performed at [place] [address] [place], [place] [address]    Special Requests   Final    NONE Performed at [facility], [place] [address] [place], [place] [address]    Gram Stain   Final    ABUNDANT WBC PRESENT, PREDOMINANTLY PMN FEW GRAM POSITIVE RODS    Culture   Final    FEW PSEUDOMONAS AERUGINOSA Sent to [place] for further susceptibility testing. Performed at [facility], [place] [address], [place],  [address]    Report Status PENDING  Incomplete   Organism ID, Bacteria PSEUDOMONAS AERUGINOSA  Final      Susceptibility   Pseudomonas aeruginosa - MIC*    CEFTAZIDIME 16 INTERMEDIATE Intermediate  CIPROFLOXACIN 2 INTERMEDIATE Intermediate     GENTAMICIN 2 SENSITIVE Sensitive     IMIPENEM >=16 RESISTANT Resistant     CEFEPIME 16 INTERMEDIATE Intermediate     * FEW PSEUDOMONAS AERUGINOSA     Time coordinating discharge: 45 minutes  SIGNED:   Tawni Millers, MD  [facility] 06/01/2019, 1:58 PM

## 2019-06-01 NOTE — Progress Notes (Addendum)
[facility] arranged for  7:30 pm transport after the patient receives his infusion. [facility] staff aware of home vent (trilogy 100) per md rx.  Daughter Hospital doctor ready to accept the patient at home.
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
## 2019-06-05 LAB — CULTURE, RESPIRATORY W GRAM STAIN

## 2019-06-06 NOTE — Progress Notes (Signed)
Antimicrobial Stewardship - Brief Note  Patient with history of MDR pseudomonas aeruginosa.  Susc for ceftolazone/tazo, ceftazidime/avibactam and pip/tazo were sent to [facility] (see results below).  Patient was sent out on zerbaxa (ceftolazone/tazo) which was susc against organism.   Doreene Eland, PharmD, BCPS.   Work Cell: [phone] 06/06/2019 10:35 AM

## 2019-06-15 ENCOUNTER — Telehealth: Payer: Self-pay | Admitting: Internal Medicine

## 2019-06-15 NOTE — Telephone Encounter (Signed)
Talked with patient's daughter Luetta Nutting and have scheduled a [place] for 06/19/19 @ 2 PM.

## 2019-06-19 ENCOUNTER — Other Ambulatory Visit: Payer: Self-pay

## 2019-06-19 ENCOUNTER — Other Ambulatory Visit: Payer: Medicaid Other | Admitting: Internal Medicine

## 2019-06-19 DIAGNOSIS — Z515 Encounter for palliative care: Secondary | ICD-10-CM

## 2019-06-19 NOTE — Progress Notes (Signed)
June 22nd, 2020 [facility] Pall[facility] Consult Note Telephone: [phone]) [phone]  Fax: [phone]  Due to the current COVID-19 infection/crises, the patient and family prefer, and have given their verbal consent for, a provider visit via telehealth, from my office. HIPPA policies of confidentially were discussed.  PATIENT NAME: Cody Vaughn DOB: 05/08/1971 MRN: [id number]  PRIMARY CARE PROVIDER:   Patient, No Pcp Per  REFERRING PROVIDER:  Dr. Annita BrodPhilip Vaughn   RESPONSIBLE PARTY: (dtr) Cody Vaughn (HCPOA).[phone], (dtr) Cody StanfordJessica Vaughn, (dtr) Cody CouchKristina  [facility] ([facility]?) transported by ambulance  ASSESSMENT / RECOMMENDATIONS:      *Patient asleep during consult. Primary source of information was his daughter (PCG/HCPOA Buyer, retailAmber Vaughn).  1.Cognitive / Functional decline: Daughter Hospital doctorAmber reports patient is A & O x 3. He's able to make his needs known but minimal speech. His day night schedule is flipped, but he started taking Melatonin and that seems to be helping. His hospital bed is situated in the living room. He has been a tetraplegic since last July; consequence of spinal cord injury. Previously he was able to walk a little. He is totally dependent for assist turning side to side in bed, for hygiene, dressing, and feeding. Utilizes Hoyer lift for transfers. He has contractures of his extremities. Nsg provides passive ROM. His sacral pressure injury is improving; no longer on wound VAC. Visiting nurse reports red/pink wound bed without signs infection. Patient has chronic pain (cramping) bilateral neck and shoulders. Cody reports hydrocodone/acetaminophen "takes the edge off". He has on orthopedic appointment tomorrow. He consumes a regular diet without signs of aspiration. He receives his meds via syringe to G-tube. He has a suprapubic tube. Daughter notes some blood tinged urine (on Xarelto); no foul smell.  2. Family Supports/Coping:  Patient lives  in the home of his daughter Cody Vaughn, who is patient's HCPOA. Cody's husband is currently not living in the home. Cody's 2 children ages 934 and 3711 reside here as well.  [facility] provide nursing/aide coverage 16 hrs/day, 7 d/wk.  Patient is on disability and Medicaid. Cody reports all expenses (save some low medicine co-pays) are covered. Cody reports her care of her dad is "doable". She is not resentful of her role as a caregiver. Patient has two other daughters who do not have the emotional reserves to assist in their dad's care. Cody reports her dad feels guilty for needing his daughter's care, and constantly apologizes. Cody sleeps on the Vaughn next to her dad at night, so she can trouble shoot the vent should problems arise, and provide suctioning of trach.   -I'll check if [facility] has a LCSW available for counseling support, if COVID-19 restrictions lifted. 3. Advanced Care Directive: Per Cody's report, patient wishes full resuscitative efforts in the event of a cardiopulmonary arrest. 4. Goals of Care: Cody reports patient hopes to gain in strength and mobility. Cody's goal is to keep patient out of the hospital if possible. Over this last year, he has been in the hospital or rehab the majority of the time. 6. Follow up Palliative Care Visit: I'll call to schedule in about 1 month.  I spent 60 minutes providing this consultation, from 2pm to 3pm. More than 50% of the time in this consultation was spent coordinating communication.   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 He has h/o chronic anemia (Hgb 8.2 05/2019), Stage IV sacral decubitus ulcer (home wound VAC),  chronic R shoulder pain (Percocet), seizure disorder, DVT  (Xarelto), cervical spinal stenosis, orthostatic hypotension (neurogenic), myelomalacia, chronic pain syndrome, and moderate protein calorie malnutrition. Hospital admission 5/27-06/01/2019 for R LL pneumonia/sepsis. Palliative Care was asked to help address goals of care.   CODE STATUS: Wants full resuscitative efforts.  PPS: 30% HOSPICE ELIGIBILITY/DIAGNOSIS: TBD  PAST MEDICAL HISTORY: History reviewed. No pertinent past medical history.  SOCIAL HX:  Social History   Tobacco Use  . Smoking status: Not on file  Substance Use Topics  . Alcohol use: Not on file    ALLERGIES:  Allergies  Allergen Reactions  . Penicillins Other (See Comments)    Daughter isn't sure what kind of reaction her father had with penicillin     PERTINENT MEDICATIONS:  Outpatient Encounter Medications as of 06/19/2019  Medication Sig  . celecoxib (CELEBREX) 100 MG capsule Take 100 mg by mouth 2 (two) times daily.  . famotidine (PEPCID) 40 MG tablet Take 40 mg by mouth at bedtime as needed for heartburn or indigestion.  . folic acid (FOLVITE) 1 MG tablet Take 1 mg by mouth daily.  Marland Kitchen gabapentin (NEURONTIN) 300 MG capsule Take 600 mg by mouth 3 (three) times daily.   Marland Kitchen ipratropium-albuterol (DUONEB) 0.5-2.5 (3) MG/3ML SOLN Take 3 mLs by nebulization every 4 (four) hours as needed (wheezing, shortness of breath).  . Lacosamide 100 MG TABS Take 100 mg by mouth 2 (two) times daily.  . methocarbamol (ROBAXIN) 500 MG tablet Take 1,250 mg by mouth 3 (three) times daily. 1 and 1/2 tabs (1250mg ) tid  . midodrine (PROAMATINE) 10 MG tablet Take 10 mg by mouth every 8 (eight) hours as needed. For BP < 100  . Multiple Vitamin (MULTIVITAMIN) tablet Take 1 tablet by mouth daily.  . rivaroxaban (XARELTO) 20 MG TABS tablet Take 20 mg by mouth daily with supper. On 06/19/2019 daughter reports  provider plans to d/c in one month  . simethicone (MYLICON) 80 MG chewable tablet  Chew 125 mg by mouth every 6 (six) hours as needed for flatulence.   . thiamine (VITAMIN B-1) 100 MG tablet Take 100 mg by mouth daily.  Marland Kitchen venlafaxine XR (EFFEXOR-XR) 75 MG 24 hr capsule Take 75 mg by mouth 2 (two) times a day.  . [DISCONTINUED] oxyCODONE-acetaminophen (PERCOCET/ROXICET) 5-325 MG tablet Take 1 tablet by mouth every 8 (eight) hours as needed for severe pain (shoulder pain).  . Amino Acids-Protein Hydrolys (FEEDING SUPPLEMENT, PRO-STAT SUGAR FREE 64,) LIQD Take 30 mLs by mouth daily for 30 days. (Patient not taking: Reported on 06/21/2019)  . feeding supplement, ENSURE ENLIVE, (ENSURE ENLIVE) LIQD Take 237 mLs by mouth daily for 30 days. (Patient not taking: Reported on 06/21/2019)  . nutrition supplement, JUVEN, (JUVEN) PACK Take 1 packet by mouth 2 (two) times daily between meals for 30 days. (Patient not taking: Reported on 06/21/2019)   No facility-administered encounter medications on file as of 06/19/2019.     PHYSICAL EXAM:  Limited PE d/t telehealth nature of visit Patient is sleeping, NAD, frail appearing, slender PE deferred d/t telehealth nature of visit Neurological: Weakness but otherwise nonfocal  Julianne Handler, NP

## 2019-06-21 ENCOUNTER — Encounter: Payer: Self-pay | Admitting: Internal Medicine

## 2019-07-31 ENCOUNTER — Telehealth: Payer: Self-pay | Admitting: Internal Medicine

## 2019-07-31 NOTE — Telephone Encounter (Signed)
1pm:   TC to patient's daughter Darnell [phone] to schedule f/u PC visit from 6/22/202). I left voice message and my contact information, with request to call back.  Violeta Gelinas NP-C [phone]

## 2019-07-31 NOTE — Telephone Encounter (Signed)
This is a duplicate.

## 2019-08-04 ENCOUNTER — Other Ambulatory Visit: Payer: Medicaid Other | Admitting: Internal Medicine

## 2019-08-04 ENCOUNTER — Other Ambulatory Visit: Payer: Self-pay

## 2019-08-04 DIAGNOSIS — Z515 Encounter for palliative care: Secondary | ICD-10-CM

## 2019-08-06 NOTE — Progress Notes (Addendum)
Aug 7th, 2020 [facility] Consult Note Telephone: [phone]  Fax: [phone]   Due to the current COVID-19 infection/crises, the family prefer, and have given their verbal consent for, a provider visit via telemedicine. HIPPA policies of confidentially were discussed. Video-audio (telehealth) contact was unable to be done due technical barriers from the patients side.   PATIENT NAME: Cody Vaughn Vaughn DOB: Oct 30, 1971 MRN: [id number] Cody Vaughn Vaughn [address] [place]   PRIMARY CARE PROVIDER / REFERRING PROVIDER:  Dr. Clovia Cuff    RESPONSIBLE PARTY: (dtr) Cody Vaughn Vaughn (HCPOA).[phone], (dtr) Cody Vaughn Vaughn, (dtr) Cody Vaughn Vaughn [facility]    ASSESSMENT / RECOMMENDATIONS:      *Primary source of information was his daughter (PCG/HCPOA Cody Vaughn Vaughn). 1.Advance Care Planning: A. Advanced Care Directives: Patient wishes full resuscitative efforts in the event of cardiopulmonary arrest.  B. Goals of Care:  -Gain in strength and mobility. -Keep out of the Vaughn if possible.  2.Cognitive / Functional status: A & O x 3. Hes able to make his needs known but minimal speech. Total dependence for turning, transfers [facility] lift), hygiene, dressing, and feeding. Extremity contractures. Large open sacral pressure injury; dsg changes by visiting SN (Dankins solution) qd. Medication via G-tube syringe. Good oral appetite regular diet without signs of aspiration. Last weight check about a month ago 150lbs. Daughter reports over all appearance of weight gain; needing larger size shorts. Constant R shoulder pain for the last 2-2.5 weeks d/t shoulder looks out of joint. Daughter plans to f/u with ortho for visit. Pain interfering with sleep.   3. Family Supports/Coping:  Patient lives in the home of his daughter Cody Vaughn Vaughn, who is patients HCPOA. Cody Vaughn Vaughn husband is currently not living in the home. Ambers 2 children ages 20 and 86 reside here as  well.  [facility] provide nursing/aide coverage 16 hrs/day, 7 d/wk. Awaiting PT referral. Patient is on disability and Medicaid. Cody Vaughn reports all expenses (save some low medicine co-pays) are covered. Cody Vaughn reports her care of her dad is doable. She is not resentful of her role as a caregiver. Patient has two other daughters who do not have the emotional reserves to assist in their dads care. Cody Vaughn reports her dad feels guilty for needing his daughters care, and constantly apologizes. Cody Vaughn sleeps on the couch next to her dad at night, so she can trouble shoot the vent should problems arise, and provide suctioning of trach.              -Ill check if Cody Vaughn Vaughn has a LCSW available for counseling support, if COVID-19 restrictions lifted.  4. Follow up Palliative Care Visit: Ill call mid to late Sept to schedule..   I spent 30 minutes providing this consultation, from 10am to 10:72m. More than 50% of the time in this consultation was spent coordinating communication.    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 He has h/o chronic anemia (Hgb 8.2 05/2019), Stage IV sacral decubitus ulcer (home wound VAC), chronic R shoulder pain (Percocet), seizure disorder, DVT  (Xarelto), cervical spinal stenosis, orthostatic hypotension (neurogenic), myelomalacia, chronic pain syndrome, and moderate protein calorie malnutrition. Vaughn admission 5/27-06/01/2019 for R LL pneumonia/sepsis. This is a f/u Palliative Care visit from  06/19/2019.    CODE STATUS: Wants full resuscitative efforts.   PPS: 30%  HOSPICE ELIGIBILITY/DIAGNOSIS: TBD  SOCIAL HX:  Social History   Tobacco Use   Smoking  status: Not on file  Substance Use Topics   Alcohol use: Not on file    ALLERGIES:  Allergies  Allergen Reactions   Penicillins Other (See Comments)    Daughter isn't sure what kind of reaction her father had with penicillin     PERTINENT MEDICATIONS:  Outpatient Encounter Medications as of 08/04/2019  Medication Sig   celecoxib (CELEBREX) 100 MG capsule Take 100 mg by mouth 2 (two) times daily.   famotidine (PEPCID) 40 MG tablet Take 40 mg by mouth at bedtime as needed for heartburn or indigestion.   folic acid (FOLVITE) 1 MG tablet Take 1 mg by mouth daily.   gabapentin (NEURONTIN) 300 MG capsule Take 600 mg by mouth 3 (three) times daily.    HYDROcodone-acetaminophen (HYCET) 7.5-325 mg/15 ml solution Take 15 mLs by mouth every 6 (six) hours as needed for moderate pain.   ipratropium-albuterol (DUONEB) 0.5-2.5 (3) MG/3ML SOLN Take 3 mLs by nebulization every 4 (four) hours as needed (wheezing, shortness of breath).   Lacosamide 100 MG TABS Take 100 mg by mouth 2 (two) times daily.   methocarbamol (ROBAXIN) 500 MG tablet Take 1,250 mg by mouth 3 (three) times daily. 1 and 1/2 tabs (1250mg ) tid   midodrine (PROAMATINE) 10 MG tablet Take 10 mg by mouth every 8 (eight) hours as needed. For BP < 100   Multiple Vitamin (MULTIVITAMIN) tablet Take 1 tablet by mouth daily.   rivaroxaban (XARELTO) 20 MG TABS tablet Take 20 mg by mouth daily with supper. On 06/19/2019 daughter reports  provider plans to d/c in one month   simethicone (MYLICON) 80 MG chewable tablet Chew 125 mg by mouth every 6 (six) hours as needed for flatulence.    thiamine (VITAMIN B-1) 100 MG tablet Take 100 mg by mouth daily.   venlafaxine XR (EFFEXOR-XR) 75 MG 24 hr capsule Take 75 mg by mouth 2 (two) times a day.   No facility-administered encounter medications on file as of 08/04/2019.     PHYSICAL EXAM:   PE deferred d/t telehealth nature of visit; audio only.  Cody Vaughn Cody Vaughn Vaughn Cody Josiah Nieto, NP

## 2019-08-13 ENCOUNTER — Encounter: Payer: Self-pay | Admitting: Internal Medicine

## 2020-05-15 ENCOUNTER — Telehealth: Payer: Self-pay

## 2020-05-15 NOTE — Telephone Encounter (Signed)
Volunteer support call for palliative care, message left 

## 2020-10-02 IMAGING — CT CT CHEST WITH CONTRAST
2 of 6 series · 11 of 36 positions shown, 13 images · IV contrast (omnipaque)
Comparison: 02/23/2019 chest CT angiogram. 04/24/2017 CT
abdomen/pelvis.

CLINICAL DATA: Inpatient. Fever of unknown origin. Quadriplegia.
Hypoxia. Stage IV decubitus ulcer. Chronic HCV.

EXAM:
CT CHEST, ABDOMEN, AND PELVIS WITH CONTRAST
TECHNIQUE: Multidetector CT imaging of the chest, abdomen and pelvis was
performed following the standard protocol during bolus
administration of intravenous contrast.
CONTRAST:  100mL OMNIPAQUE IOHEXOL 300 MG/ML  SOLN

[Series 2: cap with · axial · 0.82mm/px · z∈[-624,-69]mm · 8 of 139 slices shown, 10 images]
[im 14/139  mediastinal]
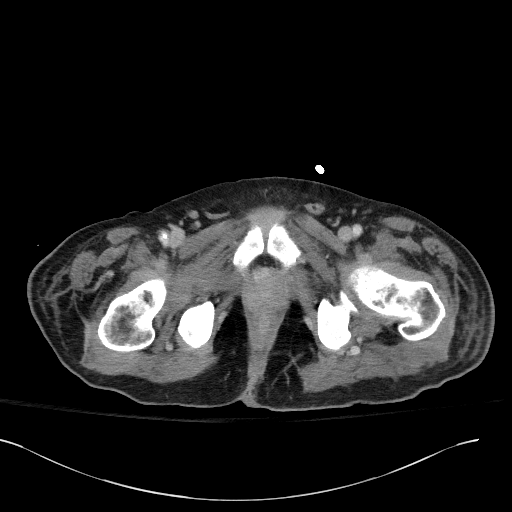
[im 14/139  lung]
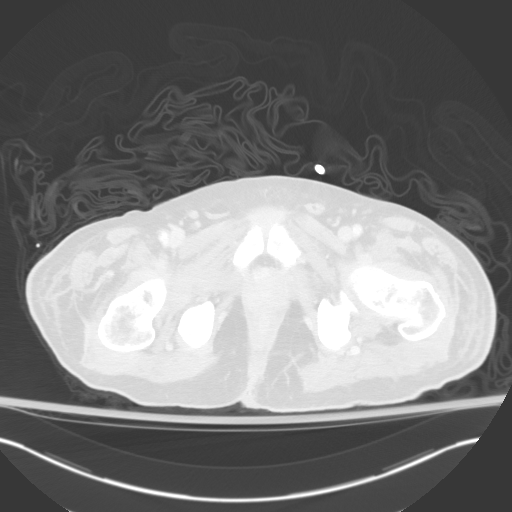
[im 28/139  lung]
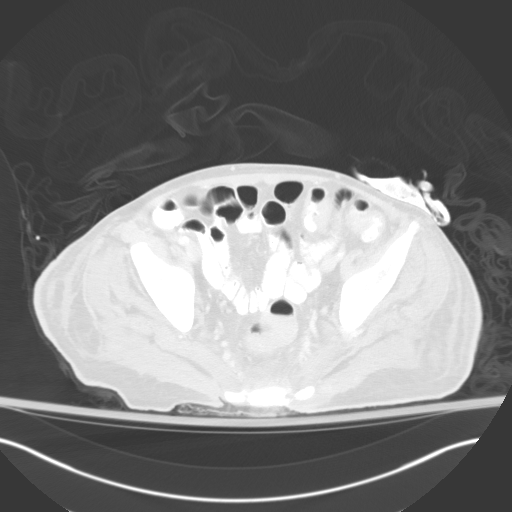
[im 42/139  lung]
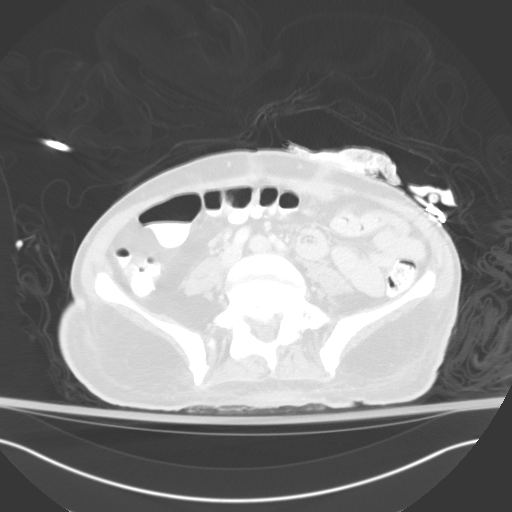
[im 56/139  lung]
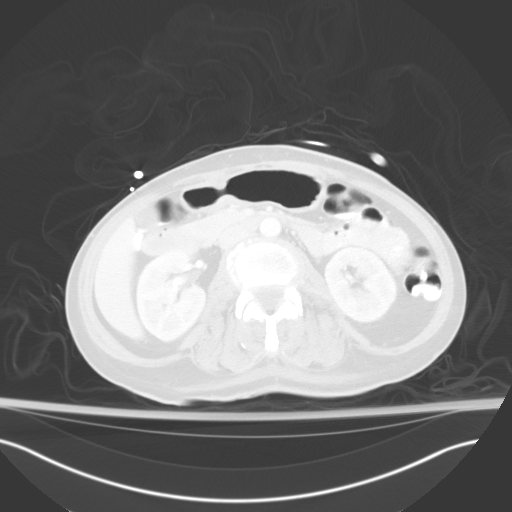
[im 83/139  mediastinal]
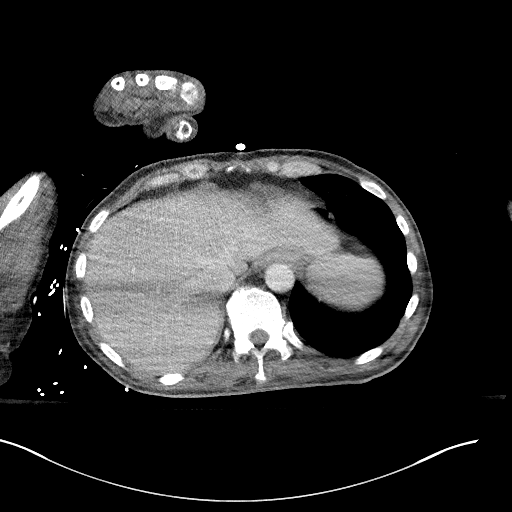
[im 83/139  lung]
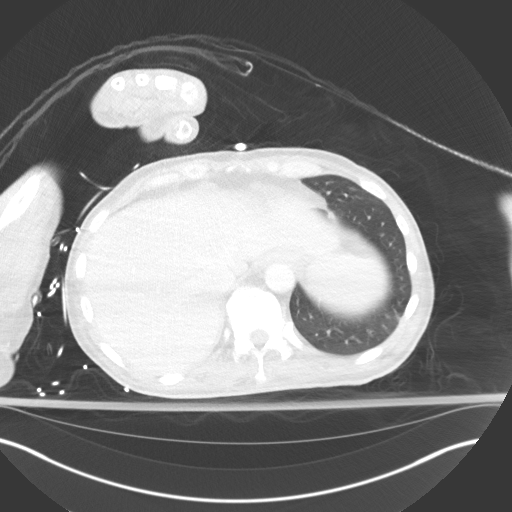
[im 97/139  lung]
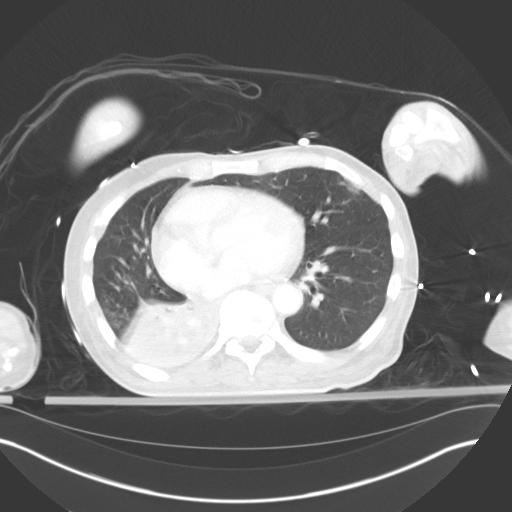
[im 111/139  lung]
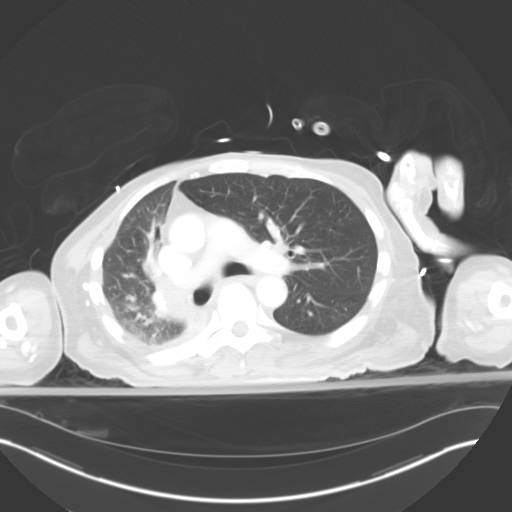
[im 125/139  lung]
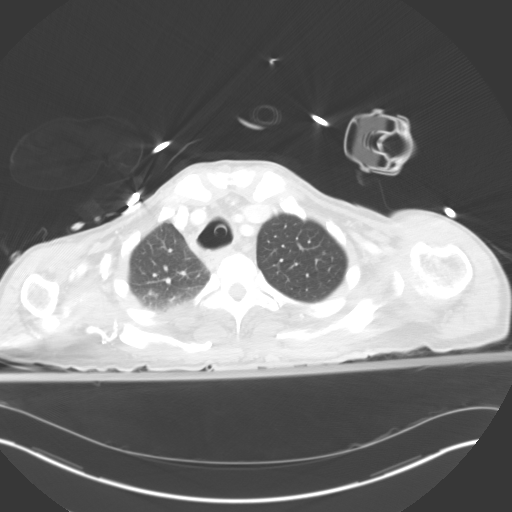

[Series 5: coronals · coronal · 0.83mm/px · 3 of 109 slices shown]
[im 22/109  lung]
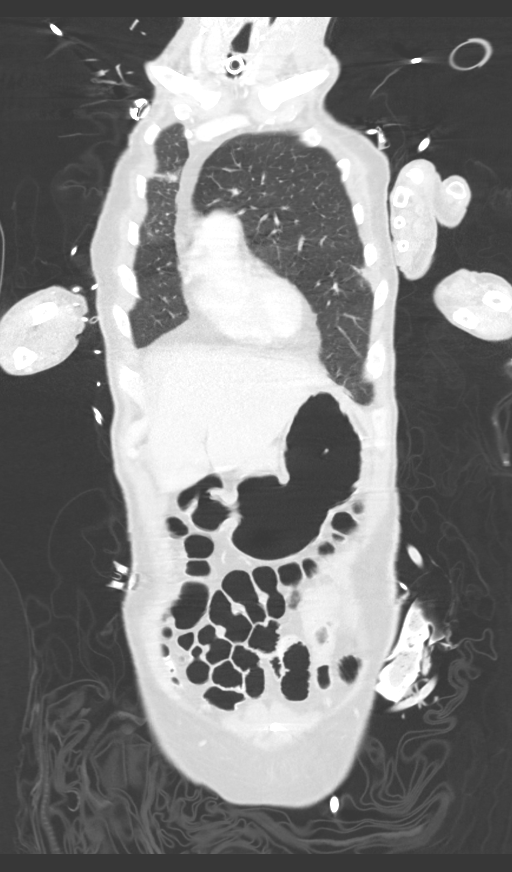
[im 44/109  lung]
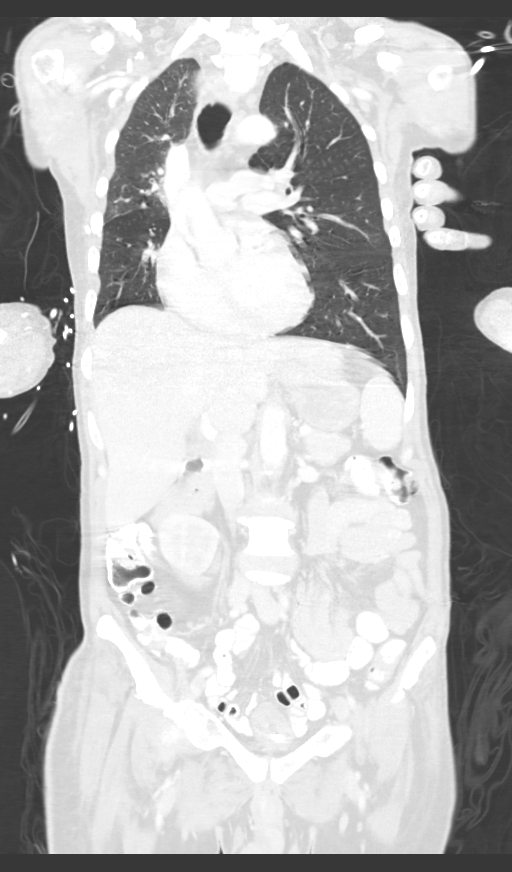
[im 65/109  lung]
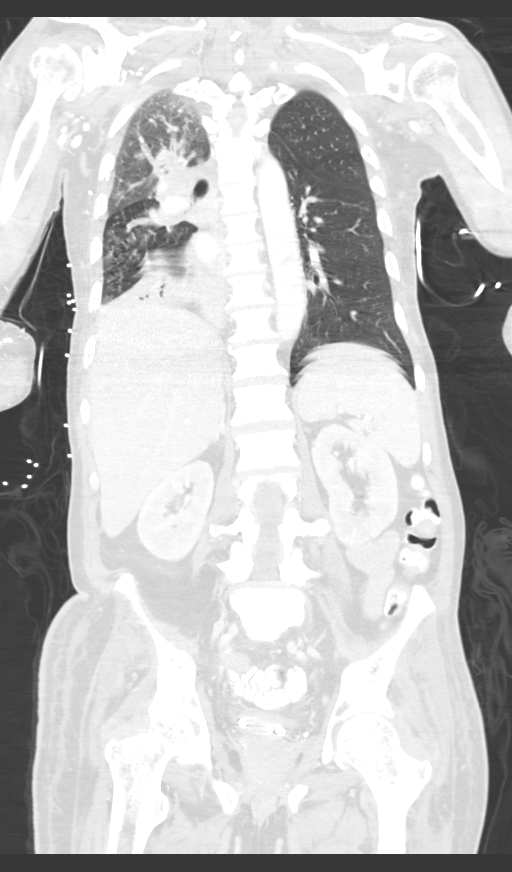

[11 of 36 positions shown; findings below may reference images not displayed]

FINDINGS: CT CHEST FINDINGS

Cardiovascular: Normal heart size. No significant pericardial
effusion/thickening. Atherosclerotic nonaneurysmal thoracic aorta.
Normal caliber pulmonary arteries. No convincing central pulmonary
emboli.

Mediastinum/Nodes: No discrete thyroid nodules. Unremarkable
esophagus. No axillary adenopathy. Newly mildly enlarged 1.0 cm
subcarinal node (series 2/image 32). No additional pathologically
enlarged mediastinal nodes. Enlarged 1.6 cm right hilar node (series
2/image 29), previously 1.6 cm using similar measurement technique,
stable. No left hilar adenopathy.

Lungs/Pleura: Tracheostomy tube terminates in the tracheal air
column just below the thoracic inlet. The central right upper lobe,
right middle lobe and entire right lower lobe airways are occluded.
There is patchy tree-in-bud opacity in the dependent and basilar
right upper lobe and dependent right middle lobe. Complete dense
consolidation of the right lower lobe with some associated volume
loss. Minimal patchy consolidation is noted in the dependent basilar
left lower lobe. No discrete lung masses or significant pulmonary
nodules. No pleural effusion.

Musculoskeletal: No aggressive appearing focal osseous lesions.
Minimal thoracic spondylosis. Advanced right glenohumeral
osteoarthritis.

CT ABDOMEN PELVIS FINDINGS

Hepatobiliary: Normal liver with no liver mass. Normal gallbladder
with no radiopaque cholelithiasis. No biliary ductal dilatation.

Pancreas: Normal, with no mass or duct dilation.

Spleen: Normal size. No mass.

Adrenals/Urinary Tract: Normal adrenals. Normal kidneys with no
hydronephrosis and no renal mass. Bladder decompressed by indwelling
suprapubic catheter and not well evaluated.

Stomach/Bowel: Percutaneous gastrostomy tube is in place in the
anterior proximal stomach. No acute gastric abnormality. Normal
caliber small bowel. There are 2 short jejunal intussusceptions in
the left abdomen (series 2/images 91 and 100). No definite small
bowel wall thickening. Oral contrast transits to the distal colon.
Appendix not discretely visualized. No pericecal inflammatory
changes. Status post subtotal distal colectomy with end sigmoid
colostomy in the ventral left abdominal wall with unremarkable
Hartmann's pouch. No definite large bowel wall thickening or
significant pericolonic fat stranding.

Vascular/Lymphatic: Minimally atherosclerotic nonaneurysmal
abdominal aorta. Patent portal, splenic, hepatic and renal veins. No
pathologically enlarged lymph nodes in the abdomen or pelvis.

Reproductive: Top-normal size prostate.

Other: No pneumoperitoneum, ascites or focal fluid collection.

Musculoskeletal: No aggressive appearing focal osseous lesions.
Advanced degenerative disc disease and bilateral facet arthropathy
in the lower lumbar spine. Large lower sacral decubitus ulcer noted
extending to the bone with associated sclerosis and bone loss at the
sacrococcygeal junction compatible with surgical debridement and/or
chronic osteomyelitis.
IMPRESSION: 1. Complete dense right lower lobe consolidation. Minimal patchy
dependent left lung base consolidation. Patchy tree-in-bud opacities
in the right upper and right middle lobes. Central right airways are
occluded, presumably by mucoid impaction. Findings are most
compatible with multilobar pneumonia predominantly involving the
right lower lobe.
2. New mild subcarinal lymphadenopathy, nonspecific, statistically
most likely to be reactive. Stable chronic mild right hilar
adenopathy, most compatible with reactive adenopathy.
3. Well-positioned tracheostomy tube, gastrostomy tube and
suprapubic bladder catheter.
4. No acute bowel abnormality. No abdominal abscess. Expected
postsurgical changes from end sigmoid colostomy.
5. Large lower sacral decubitus ulcer with sclerosis and bone loss
at the sacrococcygeal junction compatible with surgical debridement
and/or chronic osteomyelitis.
6.  Aortic Atherosclerosis ([id number]).

## 2020-10-02 IMAGING — DX PORTABLE CHEST - 1 VIEW
1 series · 1 of 1 positions shown · non-contrast
Comparison: 05/24/2019

CLINICAL DATA: Check central line placement

EXAM:
PORTABLE CHEST 1 VIEW

[chest ap]
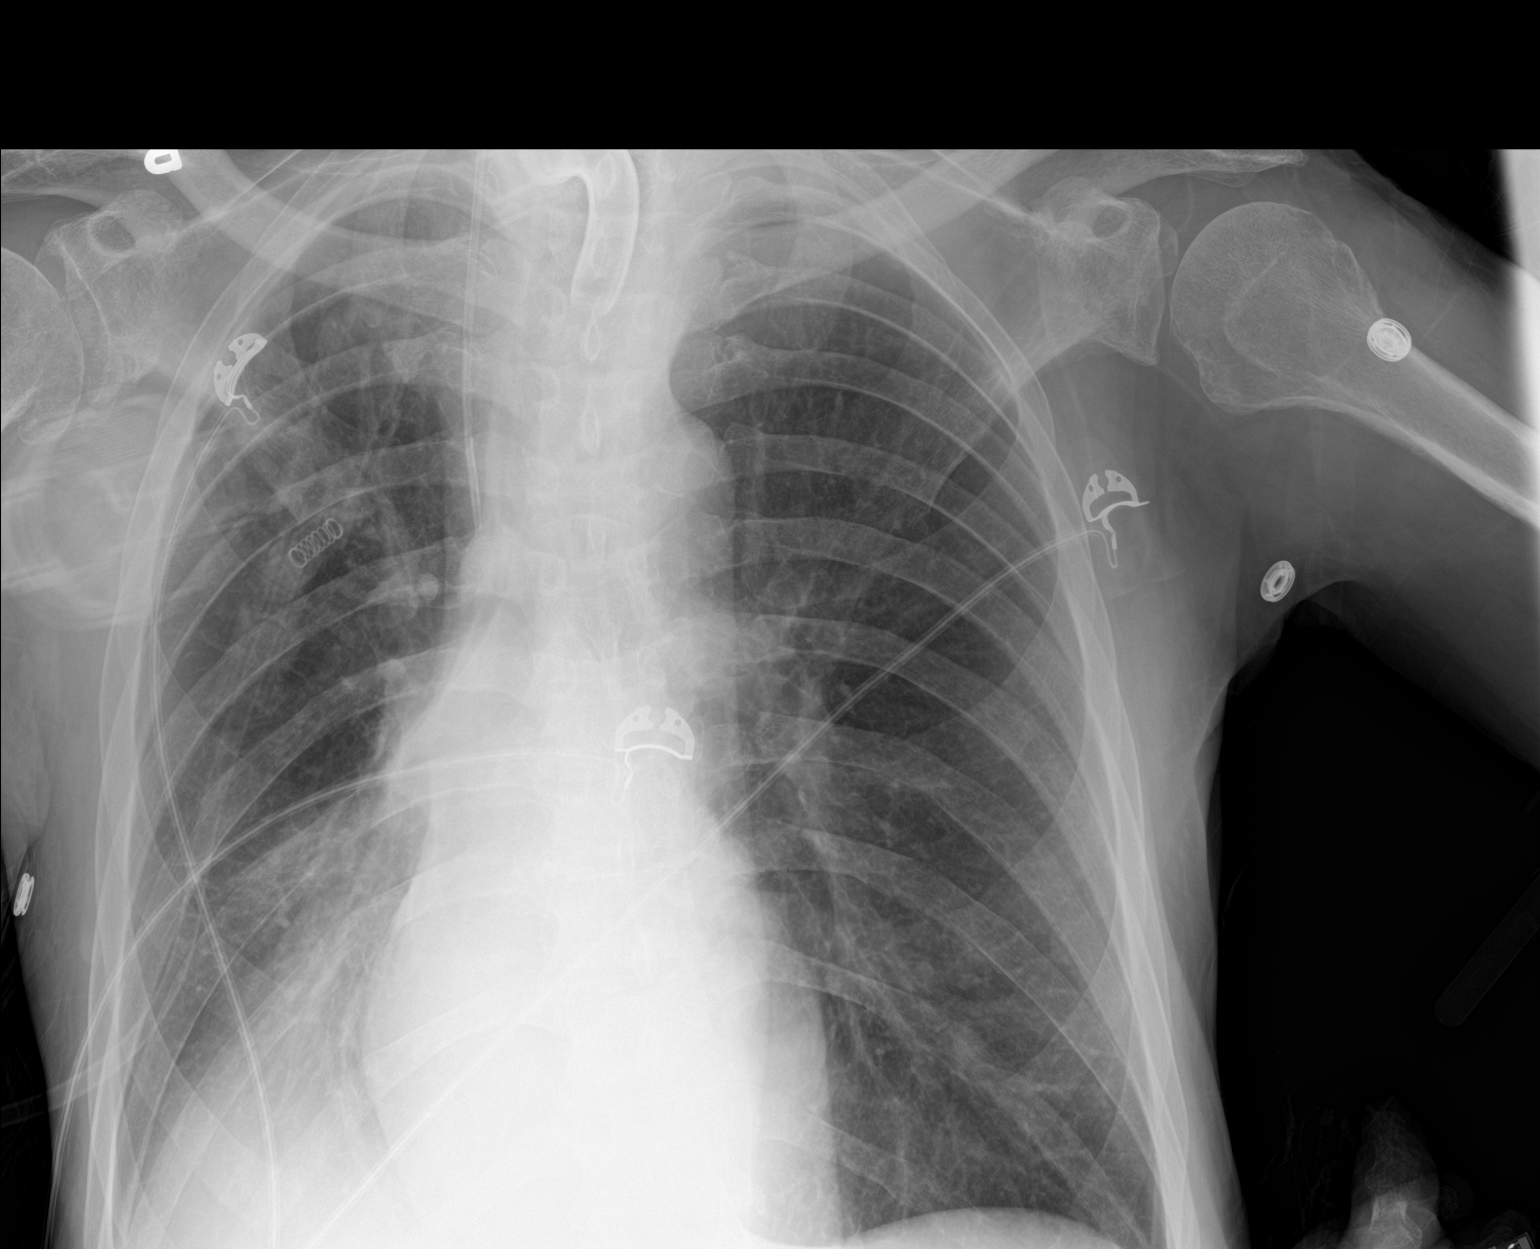

[1 of 1 positions shown; findings below may reference images not displayed]

FINDINGS: Cardiac shadows within normal limits. Tracheostomy tube is noted.
Right jugular central line is noted in the proximal superior vena
cava. Increasing density is noted in the right lung base consistent
with evolving atelectasis/early infiltrate. Left lung is clear. No
pneumothorax is noted.
IMPRESSION: Right jugular central line without pneumothorax.

Increased right basilar density consistent with evolving
atelectasis/infiltrate.
